# Patient Record
Sex: Female | Born: 1995 | Race: White | Hispanic: No | Marital: Married | Smoking: Never smoker
Health system: Southern US, Community
[De-identification: ages and names within clinical notes are randomized; demographics above are authoritative.]

## PROBLEM LIST (undated history)

## (undated) HISTORY — PX: CHOLECYSTECTOMY: SHX55

---
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
## 2015-03-17 DIAGNOSIS — M7540 Impingement syndrome of unspecified shoulder: Secondary | ICD-10-CM | POA: Insufficient documentation

## 2015-10-16 ENCOUNTER — Ambulatory Visit
Admission: RE | Admit: 2015-10-16 | Discharge: 2015-10-16 | Disposition: A | Discharge status: Home / Self Care | Payer: Medicaid Other | Source: Ambulatory Visit | Attending: Internal Medicine | Admitting: Internal Medicine

## 2015-10-16 ENCOUNTER — Other Ambulatory Visit: Payer: Self-pay | Admitting: Internal Medicine

## 2015-10-16 DIAGNOSIS — R1111 Vomiting without nausea: Secondary | ICD-10-CM | POA: Insufficient documentation

## 2015-10-16 DIAGNOSIS — R12 Heartburn: Secondary | ICD-10-CM | POA: Diagnosis not present

## 2016-10-14 IMAGING — US US ABDOMEN COMPLETE
1 series · 14 of 25 positions shown · non-contrast
Comparison: CT Abdomen and Pelvis 09/19/2013.

CLINICAL DATA: 20-year-old female with chronic postprandial nausea
and heartburn. Initial encounter.

EXAM:
ABDOMEN ULTRASOUND COMPLETE

[Series 1: us abdomen complete · 0.20mm/px · 14 of 85 slices shown]
[im 1/85]
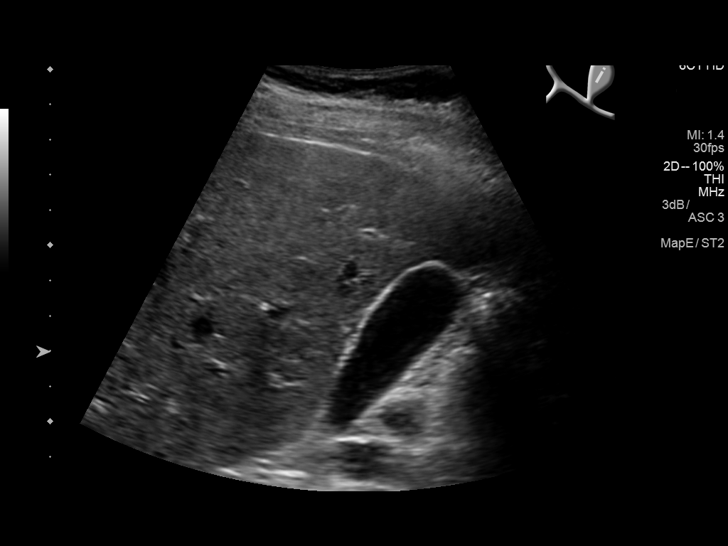
[im 8/85]
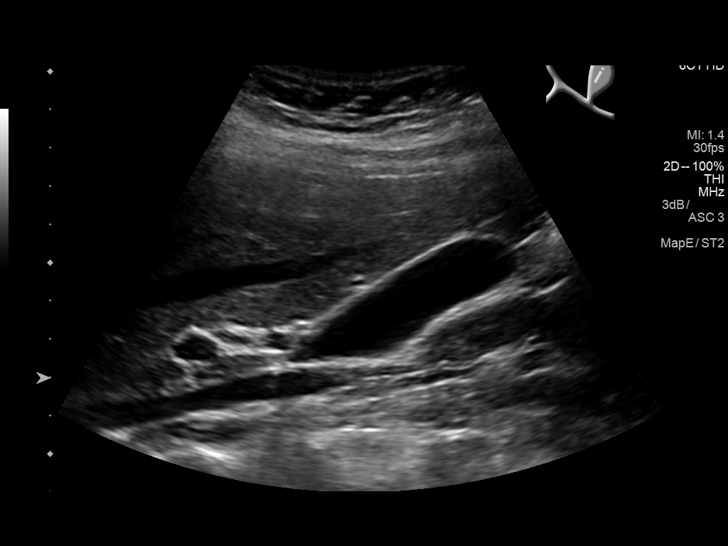
[im 15/85]
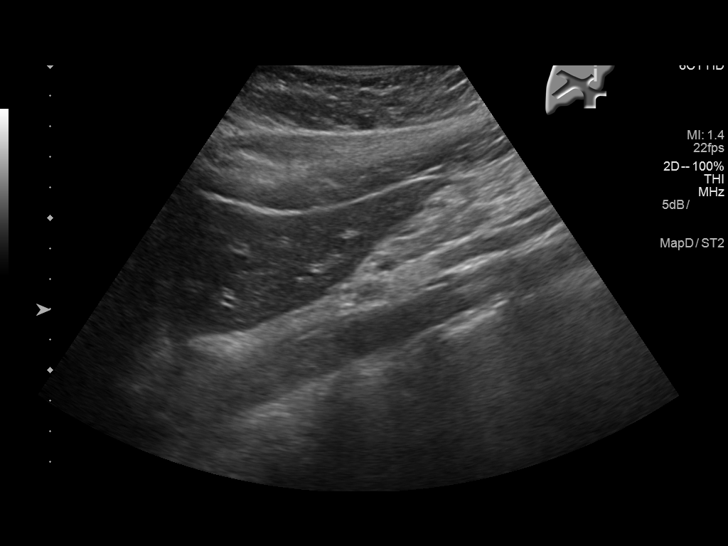
[im 22/85]
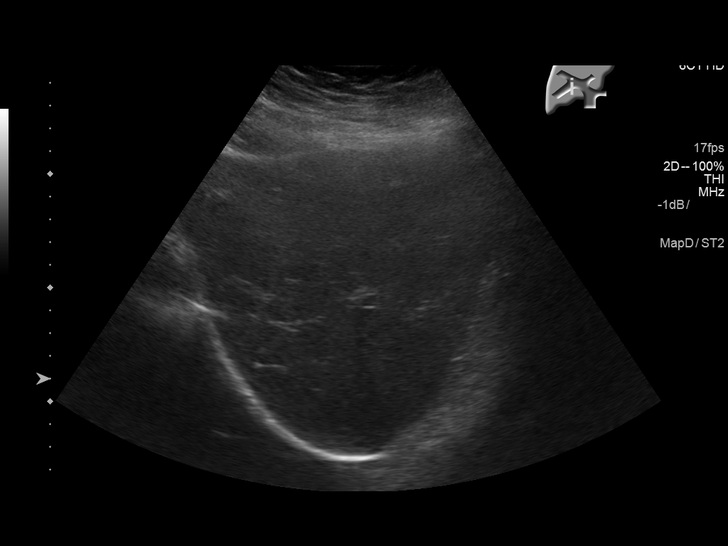
[im 29/85]
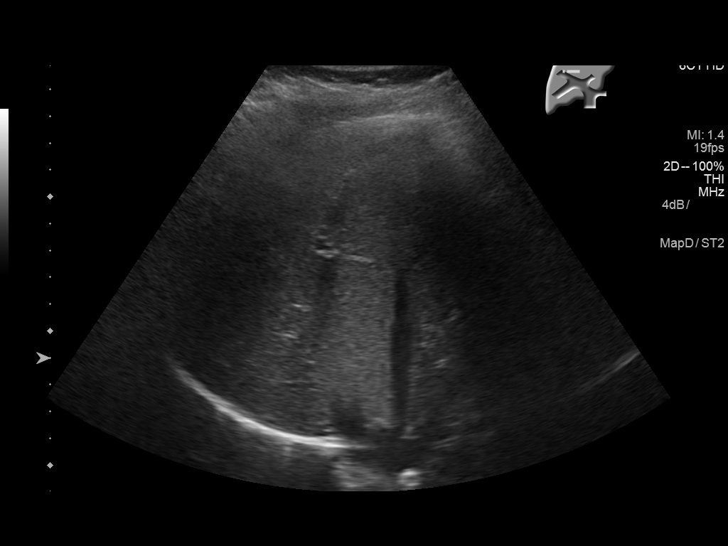
[im 32/85]
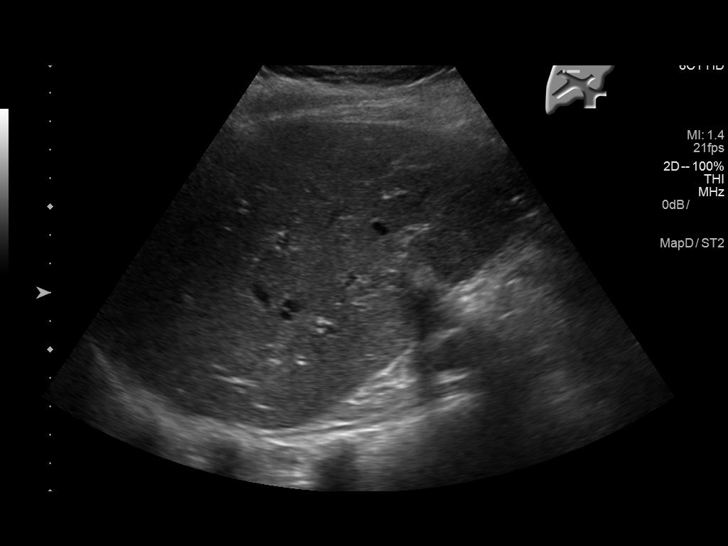
[im 39/85]
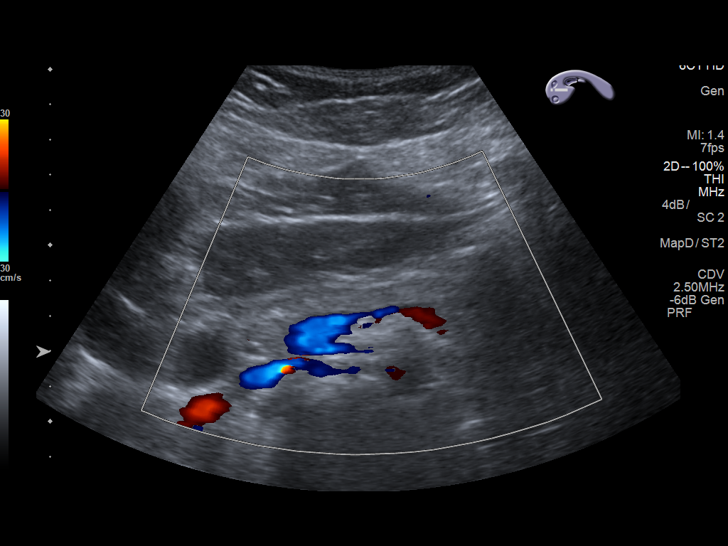
[im 46/85]
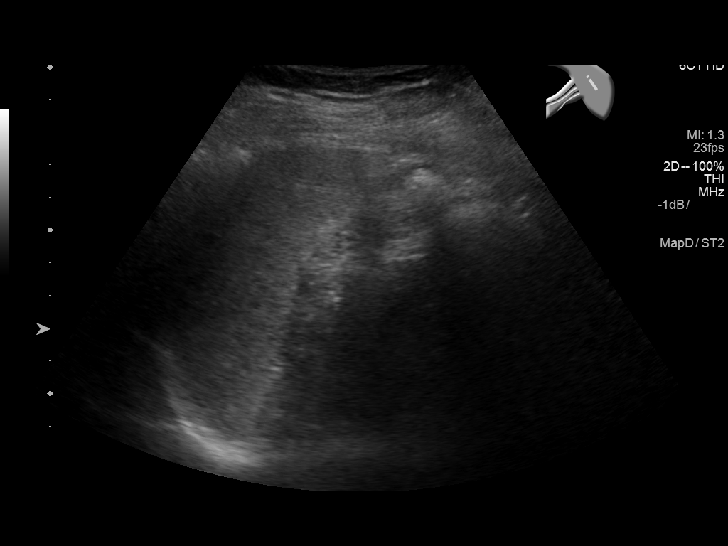
[im 53/85]
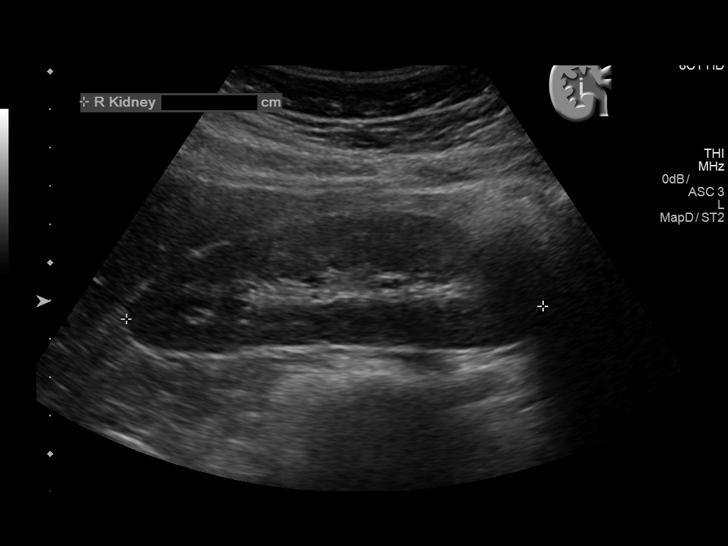
[im 57/85]
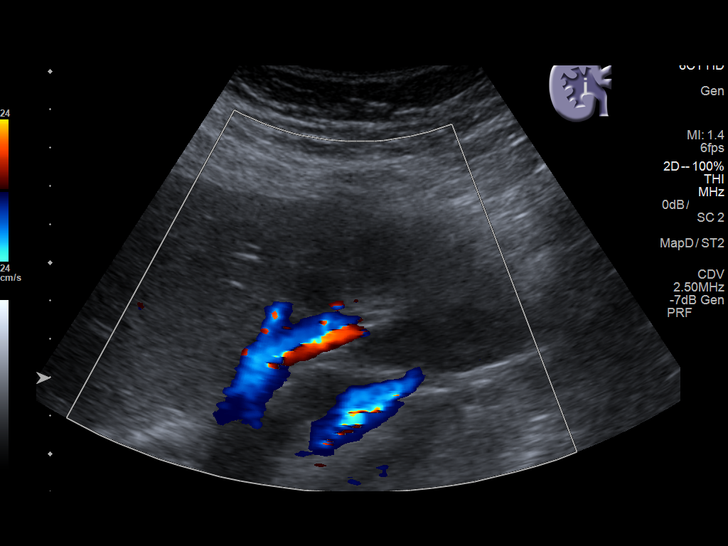
[im 64/85]
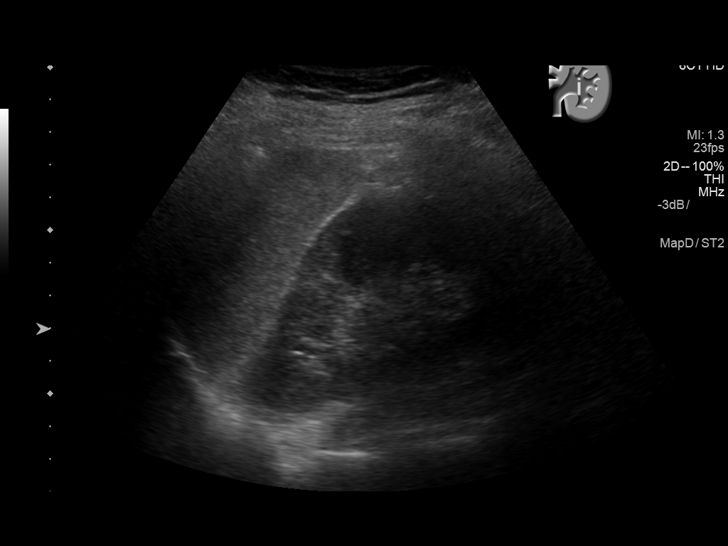
[im 71/85]
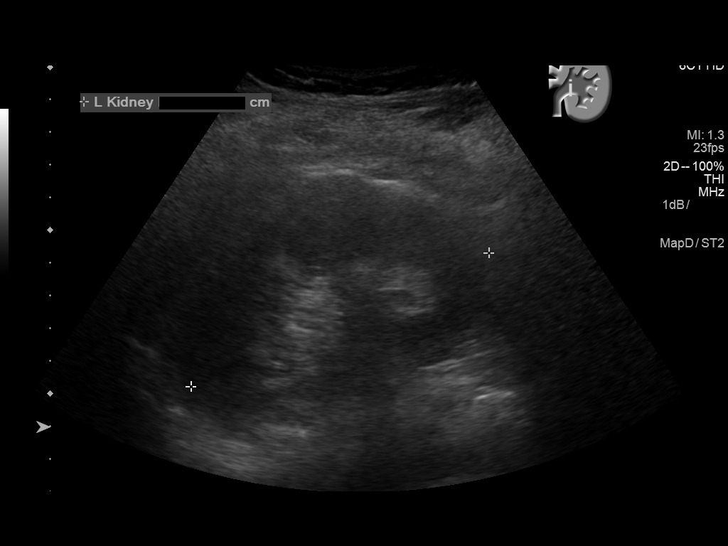
[im 78/85]
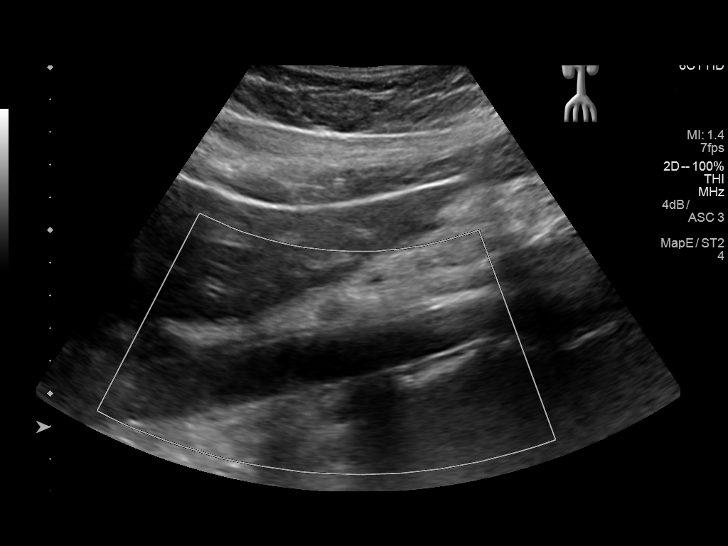
[im 85/85]
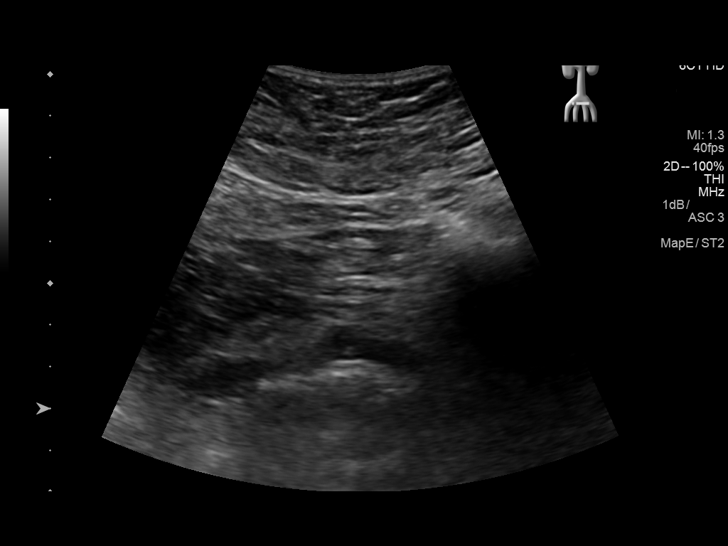

[14 of 25 positions shown; findings below may reference images not displayed]

FINDINGS: Gallbladder: No gallstones or wall thickening visualized. No
sonographic Murphy sign noted by sonographer. No pericholecystic
fluid.

Common bile duct: Diameter: 3 mm, normal

Liver: No focal lesion identified. Within normal limits in
parenchymal echogenicity.

IVC: No abnormality visualized.

Pancreas: Visualized portion unremarkable.

Spleen: Size and appearance within normal limits.

Right Kidney: Length: 10.9 cm. Echogenicity within normal limits. No
mass or hydronephrosis visualized.

Left Kidney: Length: 10.1 cm. Echogenicity within normal limits. No
mass or hydronephrosis visualized.

Abdominal aorta: No aneurysm visualized.

Other findings: None.
IMPRESSION: Normal abdomen ultrasound.  Normal gallbladder.

## 2017-02-03 DIAGNOSIS — R0789 Other chest pain: Secondary | ICD-10-CM | POA: Insufficient documentation

## 2018-04-03 ENCOUNTER — Ambulatory Visit: Payer: Self-pay | Admitting: Adult Health

## 2018-04-03 ENCOUNTER — Encounter: Payer: Self-pay | Admitting: Adult Health

## 2018-04-03 VITALS — BP 116/62 | HR 76 | Temp 98.7°F | Resp 14 | Ht 66.0 in | Wt 180.0 lb

## 2018-04-03 VITALS — BP 116/62 | HR 76 | Temp 98.7°F | Resp 14 | Ht 65.0 in | Wt 180.0 lb

## 2018-04-03 DIAGNOSIS — Z0189 Encounter for other specified special examinations: Secondary | ICD-10-CM

## 2018-04-03 DIAGNOSIS — Z008 Encounter for other general examination: Secondary | ICD-10-CM

## 2018-04-03 DIAGNOSIS — J029 Acute pharyngitis, unspecified: Secondary | ICD-10-CM

## 2018-04-03 DIAGNOSIS — R6889 Other general symptoms and signs: Secondary | ICD-10-CM

## 2018-04-03 DIAGNOSIS — J069 Acute upper respiratory infection, unspecified: Secondary | ICD-10-CM

## 2018-04-03 LAB — POCT RAPID STREP A (OFFICE): Rapid Strep A Screen: NEGATIVE

## 2018-04-03 LAB — POCT INFLUENZA A/B
Influenza A, POC: NEGATIVE
Influenza B, POC: NEGATIVE

## 2018-04-03 MED ORDER — BENZONATATE 100 MG PO CAPS: 100.0000 mg | ORAL_CAPSULE | Freq: Two times a day (BID) | ORAL | 0 refills | Status: DC | PRN

## 2018-04-03 MED ORDER — AZELASTINE HCL 0.15 % NA SOLN
1.0000 | Freq: Every day | NASAL | 0 refills | Status: DC
Start: 2018-04-03 — End: 2018-04-21

## 2018-04-03 NOTE — Progress Notes (Addendum)
[facility]  Subjective:     Patient ID: Felicia Curtis, female   DOB: 05-16-1995, 23 y.o.   MRN: [id number]  HPI   Blood pressure 116/62, pulse 76, temperature 98.7 F (37.1 C), temperature source Oral, resp. rate 14, height 5\' 5"  (1.651 m), weight 180 lb (81.6 kg), last menstrual period 03/07/2018, SpO2 98 %.  Patient is a  23 year old female in no acute distress who comes to the clinic for a biometric screening with brief biometric screening with labs include fasting glucose and lipids.   She also complains of symptoms of a cold that started on 03/31/18, runny nose, congestion, mild headache. She reports he is feeling better today and has nasal congestion.She eused Flonase on friday she reports. She has been using sudafed and mucinex.    History of appendectomy.  Reports non smoker. Denies alcohol or drug use. She reports she is physically active.  No LMP recorded.03/07/2017. LMP regularly.   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  Patient  denies any fever, body aches,chills, rash, chest pain, shortness of breath, nausea, vomiting, or diarrhea.    Allergies  Allergen Reactions  . Sulfa Antibiotics     No current outpatient medications on file.   There are no active problems to display for this patient.   Review of Systems  Constitutional: Positive for fatigue. Negative for activity change, appetite change, chills, diaphoresis, fever and unexpected weight change.  HENT: Positive for congestion, postnasal drip, rhinorrhea and sore throat. Negative for dental problem, drooling, ear discharge, ear pain, facial swelling, hearing loss, mouth sores, nosebleeds, sinus pressure, sinus pain, sneezing, tinnitus, trouble swallowing and voice change.   Respiratory: Positive for cough. Negative for apnea, choking, chest tightness, shortness of breath, wheezing and stridor.   Cardiovascular: Negative.    Gastrointestinal: Negative.   Genitourinary: Negative.   Musculoskeletal: Negative.   Skin: Negative.   Neurological: Negative.   Hematological: Negative.   Psychiatric/Behavioral: Negative.        Objective:   Physical Exam Vitals signs reviewed.  Constitutional:      General: She is not in acute distress.    Appearance: Normal appearance. She is not ill-appearing, toxic-appearing or diaphoretic.  HENT:     Head: Normocephalic and atraumatic.     Right Ear: Tympanic membrane, ear canal and external ear normal. There is no impacted cerumen.     Left Ear: Tympanic membrane, ear canal and external ear normal. There is no impacted cerumen.     Nose: Congestion and rhinorrhea present.     Mouth/Throat:     Mouth: Mucous membranes are dry.     Pharynx: No oropharyngeal exudate or posterior oropharyngeal erythema.  Eyes:     General: No scleral icterus.       Right eye: No discharge.        Left eye: No discharge.     Conjunctiva/sclera: Conjunctivae normal.     Pupils: Pupils are equal, round, and reactive to light.  Neck:     Musculoskeletal: Normal range of motion and neck supple. No neck rigidity or muscular tenderness.  Cardiovascular:     Rate and Rhythm: Normal rate and regular rhythm.     Pulses: Normal pulses.  Pulmonary:     Effort: Pulmonary effort is normal. No respiratory distress.     Breath sounds: Normal breath sounds. No stridor. No wheezing, rhonchi or rales.  Chest:  Chest wall: No tenderness.  Abdominal:     Palpations: Abdomen is soft.  Musculoskeletal: Normal range of motion.  Lymphadenopathy:     Cervical: No cervical adenopathy.  Skin:    General: Skin is warm and dry.     Capillary Refill: Capillary refill takes less than 2 seconds.  Neurological:     Mental Status: She is alert and oriented to person, place, and time.  Psychiatric:        Mood and Affect: Mood normal.        Behavior: Behavior normal.        Thought Content: Thought  content normal.        Judgment: Judgment normal.        Assessment:     Acute upper respiratory infection  Encounter for biometric screening - Plan: Glucose, random, Lipid Panel With LDL/HDL Ratio  FLU A and B POCT NEGATIVE.  STREP A NEGATIVE    Plan:     Gave and reviewed After Visit Summary(AVS) with patient.     Please see your primary care provider for a yearly physical and labs.  I will have the office call you on your glucose and cholesterol results when they return if you have not heard within 1 week please call the office and will have you follow up with your primary care doctor for any abnormal's. This biometric physical is not a substitute for seeing a primary care provider for a physical. Provider also recommends if you do not have a primary care provider for patient see a  primary care physician/ provider  for a routine physical and to establish primary care. Patient may chose provider of choice. Also gave the [place]  PHYSICIAN/PROVIDER  REFERRAL LINE at [phone] or web site at [email or web address] to help assist with finding a primary care doctor. Patient understands this office is acute care and no primary care is in this office.     Follow up with primary care as needed for chronic and maintenance health care- can be seen in this employee clinic for acute care.   Discussed given duration of symptoms likely viral upper respiratory infection with symptom management discussed.   Meds ordered this encounter  Medications  . Azelastine HCl 0.15 % SOLN    Sig: Place 1 spray into the nose daily for 7 days.    Dispense:  0.7 mL    Refill:  0  . benzonatate (TESSALON) 100 MG capsule    Sig: Take 1 capsule (100 mg total) by mouth 2 (two) times daily as needed for cough. For cough may cause drowsiness    Dispense:  20 capsule    Refill:  0    Advised patient call the office or your primary care doctor for an appointment if no improvement within 72 hours or if any  symptoms change or worsen at any time  Advised ER or urgent Care if after hours or on weekend. Call 911 for emergency symptoms at any time.Patinet verbalized understanding of all instructions given/reviewed and treatment plan and has no further questions or concerns at this time.     Follow up with primary care as needed for chronic and maintenance health care- can be seen in this employee clinic for acute care.

## 2018-04-03 NOTE — Progress Notes (Signed)
ERRONEOUS ENCOUNTER SEE OTHER NOTE DATED 04/03/18 - patient was on the schedule twice

## 2018-04-03 NOTE — Addendum Note (Signed)
Addended by: Karen Kays on: 04/03/2018 12:42 PM   Modules accepted: Orders

## 2018-04-03 NOTE — Patient Instructions (Addendum)
Please see your primary care provider for a yearly physical and labs.  I will have the office call you on your glucose and cholesterol results when they return if you have not heard within 1 week please call the office and will have you follow up with your primary care doctor for any abnormal's. This biometric physical is not a substitute for seeing a primary care provider for a physical. Provider also recommends if you do not have a primary care provider for patient see a  primary care physician/ provider  for a routine physical and to establish primary care. Patient may chose provider of choice. Also gave the [facility] at [phone] or web site at [email or web address] to help assist with finding a primary care doctor. Patient understands this office is acute care and no primary care is in this office.  ,   Health Maintenance, Female Adopting a healthy lifestyle and getting preventive care can go a long way to promote health and wellness. Talk with your health care provider about what schedule of regular examinations is right for you. This is a good chance for you to check in with your provider about disease prevention and staying healthy. In between checkups, there are plenty of things you can do on your own. Experts have done a lot of research about which lifestyle changes and preventive measures are most likely to keep you healthy. Ask your health care provider for more information. Weight and diet Eat a healthy diet  Be sure to include plenty of vegetables, fruits, low-fat dairy products, and lean protein.  Do not eat a lot of foods high in solid fats, added sugars, or salt.  Get regular exercise. This is one of the most important things you can do for your health. ? Most adults should exercise for at least 150 minutes each week. The exercise should increase your heart rate and make you sweat (moderate-intensity exercise). ? Most adults should also do  strengthening exercises at least twice a week. This is in addition to the moderate-intensity exercise. Maintain a healthy weight  Body mass index (BMI) is a measurement that can be used to identify possible weight problems. It estimates body fat based on height and weight. Your health care provider can help determine your BMI and help you achieve or maintain a healthy weight.  For females 51 years of age and older: ? A BMI below 18.5 is considered underweight. ? A BMI of 18.5 to 24.9 is normal. ? A BMI of 25 to 29.9 is considered overweight. ? A BMI of 30 and above is considered obese. Watch levels of cholesterol and blood lipids  You should start having your blood tested for lipids and cholesterol at 23 years of age, then have this test every 5 years.  You may need to have your cholesterol levels checked more often if: ? Your lipid or cholesterol levels are high. ? You are older than 23 years of age. ? You are at high risk for heart disease. Cancer screening Lung Cancer  Lung cancer screening is recommended for adults 19-38 years old who are at high risk for lung cancer because of a history of smoking.  A yearly low-dose CT scan of the lungs is recommended for people who: ? Currently smoke. ? Have quit within the past 15 years. ? Have at least a 30-pack-year history of smoking. A pack year is smoking an average of one pack of cigarettes a day for 1  Yearly screening should continue until it has been 15 years since you quit.  Yearly screening should stop if you develop a health problem that would prevent you from having lung cancer treatment. Breast Cancer  Practice breast self-awareness. This means understanding how your breasts normally appear and feel.  It also means doing regular breast self-exams. Let your health care provider know about any changes, no matter how small.  If you are in your 20s or 30s, you should have a clinical breast exam (CBE) by a health care  provider every 1-3 years as part of a regular health exam.  If you are 40 or older, have a CBE every year. Also consider having a breast X-ray (mammogram) every year.  If you have a family history of breast cancer, talk to your health care provider about genetic screening.  If you are at high risk for breast cancer, talk to your health care provider about having an MRI and a mammogram every year.  Breast cancer gene (BRCA) assessment is recommended for women who have family members with BRCA-related cancers. BRCA-related cancers include: ? Breast. ? Ovarian. ? Tubal. ? Peritoneal cancers.  Results of the assessment will determine the need for genetic counseling and BRCA1 and BRCA2 testing. Cervical Cancer Your health care provider may recommend that you be screened regularly for cancer of the pelvic organs (ovaries, uterus, and vagina). This screening involves a pelvic examination, including checking for microscopic changes to the surface of your cervix (Pap test). You may be encouraged to have this screening done every 3 years, beginning at age 21.  For women ages 30-65, health care providers may recommend pelvic exams and Pap testing every 3 years, or they may recommend the Pap and pelvic exam, combined with testing for human papilloma virus (HPV), every 5 years. Some types of HPV increase your risk of cervical cancer. Testing for HPV may also be done on women of any age with unclear Pap test results.  Other health care providers may not recommend any screening for nonpregnant women who are considered low risk for pelvic cancer and who do not have symptoms. Ask your health care provider if a screening pelvic exam is right for you.  If you have had past treatment for cervical cancer or a condition that could lead to cancer, you need Pap tests and screening for cancer for at least 20 years after your treatment. If Pap tests have been discontinued, your risk factors (such as having a new sexual  partner) need to be reassessed to determine if screening should resume. Some women have medical problems that increase the chance of getting cervical cancer. In these cases, your health care provider may recommend more frequent screening and Pap tests. Colorectal Cancer  This type of cancer can be detected and often prevented.  Routine colorectal cancer screening usually begins at 23 years of age and continues through 23 years of age.  Your health care provider may recommend screening at an earlier age if you have risk factors for colon cancer.  Your health care provider may also recommend using home test kits to check for hidden blood in the stool.  A small camera at the end of a tube can be used to examine your colon directly (sigmoidoscopy or colonoscopy). This is done to check for the earliest forms of colorectal cancer.  Routine screening usually begins at age 50.  Direct examination of the colon should be repeated every 5-10 years through 23 years of age. However, you may   need to be screened more often if early forms of precancerous polyps or small growths are found. Skin Cancer  Check your skin from head to toe regularly.  Tell your health care provider about any new moles or changes in moles, especially if there is a change in a mole's shape or color.  Also tell your health care provider if you have a mole that is larger than the size of a pencil eraser.  Always use sunscreen. Apply sunscreen liberally and repeatedly throughout the day.  Protect yourself by wearing long sleeves, pants, a wide-brimmed hat, and sunglasses whenever you are outside. Heart disease, diabetes, and high blood pressure  High blood pressure causes heart disease and increases the risk of stroke. High blood pressure is more likely to develop in: ? People who have blood pressure in the high end of the normal range (130-139/85-89 mm Hg). ? People who are overweight or obese. ? People who are African  American.  If you are 18-39 years of age, have your blood pressure checked every 3-5 years. If you are 40 years of age or older, have your blood pressure checked every year. You should have your blood pressure measured twice-once when you are at a hospital or clinic, and once when you are not at a hospital or clinic. Record the average of the two measurements. To check your blood pressure when you are not at a hospital or clinic, you can use: ? An automated blood pressure machine at a pharmacy. ? A home blood pressure monitor.  If you are between 55 years and 79 years old, ask your health care provider if you should take aspirin to prevent strokes.  Have regular diabetes screenings. This involves taking a blood sample to check your fasting blood sugar level. ? If you are at a normal weight and have a low risk for diabetes, have this test once every three years after 23 years of age. ? If you are overweight and have a high risk for diabetes, consider being tested at a younger age or more often. Preventing infection Hepatitis B  If you have a higher risk for hepatitis B, you should be screened for this virus. You are considered at high risk for hepatitis B if: ? You were born in a country where hepatitis B is common. Ask your health care provider which countries are considered high risk. ? Your parents were born in a high-risk country, and you have not been immunized against hepatitis B (hepatitis B vaccine). ? You have HIV or AIDS. ? You use needles to inject street drugs. ? You live with someone who has hepatitis B. ? You have had sex with someone who has hepatitis B. ? You get hemodialysis treatment. ? You take certain medicines for conditions, including cancer, organ transplantation, and autoimmune conditions. Hepatitis C  Blood testing is recommended for: ? Everyone born from 1945 through 1965. ? Anyone with known risk factors for hepatitis C. Sexually transmitted infections  (STIs)  You should be screened for sexually transmitted infections (STIs) including gonorrhea and chlamydia if: ? You are sexually active and are younger than 24 years of age. ? You are older than 24 years of age and your health care provider tells you that you are at risk for this type of infection. ? Your sexual activity has changed since you were last screened and you are at an increased risk for chlamydia or gonorrhea. Ask your health care provider if you are at risk.  If you   If you do not have HIV, but are at risk, it may be recommended that you take a prescription medicine daily to prevent HIV infection. This is called pre-exposure prophylaxis (PrEP). You are considered at risk if: ? You are sexually active and do not regularly use condoms or know the HIV status of your partner(s). ? You take drugs by injection. ? You are sexually active with a partner who has HIV. Talk with your health care provider about whether you are at high risk of being infected with HIV. If you choose to begin PrEP, you should first be tested for HIV. You should then be tested every 3 months for as long as you are taking PrEP. Pregnancy  If you are premenopausal and you may become pregnant, ask your health care provider about preconception counseling.  If you may become pregnant, take 400 to 800 micrograms (mcg) of folic acid every day.  If you want to prevent pregnancy, talk to your health care provider about birth control (contraception). Osteoporosis and menopause  Osteoporosis is a disease in which the bones lose minerals and strength with aging. This can result in serious bone fractures. Your risk for osteoporosis can be identified using a bone density scan.  If you are 93 years of age or older, or if you are at risk for osteoporosis and fractures, ask your health care provider if you should be screened.  Ask your health care provider whether you should take a calcium or vitamin D supplement to lower your risk  for osteoporosis.  Menopause may have certain physical symptoms and risks.  Hormone replacement therapy may reduce some of these symptoms and risks. Talk to your health care provider about whether hormone replacement therapy is right for you. Follow these instructions at home:  Schedule regular health, dental, and eye exams.  Stay current with your immunizations.  Do not use any tobacco products including cigarettes, chewing tobacco, or electronic cigarettes.  If you are pregnant, do not drink alcohol.  If you are breastfeeding, limit how much and how often you drink alcohol.  Limit alcohol intake to no more than 1 drink per day for nonpregnant women. One drink equals 12 ounces of beer, 5 ounces of wine, or 1 ounces of hard liquor.  Do not use street drugs.  Do not share needles.  Ask your health care provider for help if you need support or information about quitting drugs.  Tell your health care provider if you often feel depressed.  Tell your health care provider if you have ever been abused or do not feel safe at home. This information is not intended to replace advice given to you by your health care provider. Make sure you discuss any questions you have with your health care provider. Document Released: 08/31/2010 Document Revised: 07/24/2015 Document Reviewed: 11/19/2014 Elsevier Interactive Patient Education  2019 [place].  Upper Respiratory Infection, Adult An upper respiratory infection (URI) affects the nose, throat, and upper air passages. URIs are caused by germs (viruses). The most common type of URI is often called "the common cold." Medicines cannot cure URIs, but you can do things at home to relieve your symptoms. URIs usually get better within 7-10 days. Follow these instructions at home: Activity  Rest as needed.  If you have a fever, stay home from work or school until your fever is gone, or until your doctor says you may return to work or  school. ? You should stay home until you cannot spread the infection  anymore (you are not contagious). ? Your doctor may have you wear a face mask so you have less risk of spreading the infection. Relieving symptoms  Gargle with a salt-water mixture 3-4 times a day or as needed. To make a salt-water mixture, completely dissolve -1 tsp of salt in 1 cup of warm water.  Use a cool-mist humidifier to add moisture to the air. This can help you breathe more easily. Eating and drinking   Drink enough fluid to keep your pee (urine) pale yellow.  Eat soups and other clear broths. General instructions   Take over-the-counter and prescription medicines only as told by your doctor. These include cold medicines, fever reducers, and cough suppressants.  Do not use any products that contain nicotine or tobacco. These include cigarettes and e-cigarettes. If you need help quitting, ask your doctor.  Avoid being where people are smoking (avoid secondhand smoke).  Make sure you get regular shots and get the flu shot every year.  Keep all follow-up visits as told by your doctor. This is important. How to avoid spreading infection to others   Wash your hands often with soap and water. If you do not have soap and water, use hand sanitizer.  Avoid touching your mouth, face, eyes, or nose.  Cough or sneeze into a tissue or your sleeve or elbow. Do not cough or sneeze into your hand or into the air. Contact a doctor if:  You are getting worse, not better.  You have any of these: ? A fever. ? Chills. ? Brown or red mucus in your nose. ? Yellow or brown fluid (discharge)coming from your nose. ? Pain in your face, especially when you bend forward. ? Swollen neck glands. ? Pain with swallowing. ? White areas in the back of your throat. Get help right away if:  You have shortness of breath that gets worse.  You have very bad or constant: ? Headache. ? Ear pain. ? Pain in your forehead,  behind your eyes, and over your cheekbones (sinus pain). ? Chest pain.  You have long-lasting (chronic) lung disease along with any of these: ? Wheezing. ? Long-lasting cough. ? Coughing up blood. ? A change in your usual mucus.  You have a stiff neck.  You have changes in your: ? Vision. ? Hearing. ? Thinking. ? Mood. Summary  An upper respiratory infection (URI) is caused by a germ called a virus. The most common type of URI is often called "the common cold."  URIs usually get better within 7-10 days.  Take over-the-counter and prescription medicines only as told by your doctor. This information is not intended to replace advice given to you by your health care provider. Make sure you discuss any questions you have with your health care provider. Document Released: 08/04/2007 Document Revised: 10/08/2016 Document Reviewed: 10/08/2016 Elsevier Interactive Patient Education  2019 [place] capsules What is this medicine? BENZONATATE (ben ZOE na tate) is used to treat cough. This medicine may be used for other purposes; ask your health care provider or pharmacist if you have questions. COMMON BRAND NAME(S): Tessalon Perles, Zonatuss What should I tell my health care provider before I take this medicine? They need to know if you have any of these conditions: -kidney or liver disease -an unusual or allergic reaction to benzonatate, anesthetics, other medicines, foods, dyes, or preservatives -pregnant or trying to get pregnant -breast-feeding How should I use this medicine? Take this medicine by mouth with a glass of water. Follow  the directions on the prescription label. Avoid breaking, chewing, or sucking the capsule, as this can cause serious side effects. Take your medicine at regular intervals. Do not take your medicine more often than directed. Talk to your pediatrician regarding the use of this medicine in children. While this drug may be prescribed for  children as young as 85 years old for selected conditions, precautions do apply. Overdosage: If you think you have taken too much of this medicine contact a poison control center or emergency room at once. NOTE: This medicine is only for you. Do not share this medicine with others. What if I miss a dose? If you miss a dose, take it as soon as you can. If it is almost time for your next dose, take only that dose. Do not take double or extra doses. What may interact with this medicine? Do not take this medicine with any of the following medications: -MAOIs like Carbex, Eldepryl, Marplan, Nardil, and Parnate This list may not describe all possible interactions. Give your health care provider a list of all the medicines, herbs, non-prescription drugs, or dietary supplements you use. Also tell them if you smoke, drink alcohol, or use illegal drugs. Some items may interact with your medicine. What should I watch for while using this medicine? Tell your doctor if your symptoms do not improve or if they get worse. If you have a high fever, skin rash, or headache, see your health care professional. You may get drowsy or dizzy. Do not drive, use machinery, or do anything that needs mental alertness until you know how this medicine affects you. Do not sit or stand up quickly, especially if you are an older patient. This reduces the risk of dizzy or fainting spells. What side effects may I notice from receiving this medicine? Side effects that you should report to your doctor or health care professional as soon as possible: -allergic reactions like skin rash, itching or hives, swelling of the face, lips, or tongue -breathing problems -chest pain -confusion or hallucinations -irregular heartbeat -numbness of mouth or throat -seizures Side effects that usually do not require medical attention (report to your doctor or health care professional if they continue or are bothersome): -burning feeling in the  eyes -constipation -headache -nasal congestion -stomach upset This list may not describe all possible side effects. Call your doctor for medical advice about side effects. You may report side effects to FDA at 1-800-FDA-1088. Where should I keep my medicine? Keep out of the reach of children. Store at room temperature between 15 and 30 degrees C (59 and 86 degrees F). Keep tightly closed. Protect from light and moisture. Throw away any unused medicine after the expiration date. NOTE: This sheet is a summary. It may not cover all possible information. If you have questions about this medicine, talk to your doctor, pharmacist, or health care provider.  2019 Elsevier/Gold Standard (2007-05-17 14:52:56) Azelastine nasal spray What is this medicine? AZELASTINE (a ZEL as teen) nasal spray is a histamine blocker. It helps to relieve itching, running and stuffiness in the nose. This medicine is used to treat nasal symptoms from allergies and other irritants. This medicine may be used for other purposes; ask your health care provider or pharmacist if you have questions. COMMON BRAND NAME(S): Astelin, Astepro What should I tell my health care provider before I take this medicine? They need to know if you have any of these conditions: -any other medical problems -an unusual or allergic reaction to azelastine,  other nasal sprays, medicines, foods, dyes, or preservatives -pregnant or trying to become pregnant -breast-feeding How should I use this medicine? This medicine is for use only in the nose. Follow the directions on the prescription label. Do not use more often than directed. Make sure that you are using your inhaler correctly. Ask you doctor or health care provider if you have any questions. Talk to your pediatrician regarding the use of this medicine in children. While some products may be prescribed for children as young as 6 months for selected conditions, precautions do apply. Overdosage: If  you think you have taken too much of this medicine contact a poison control center or emergency room at once. NOTE: This medicine is only for you. Do not share this medicine with others. What if I miss a dose? If you miss a dose, use it as soon as you can. If it is almost time for your next dose, use only that dose. Do not use double or extra doses. What may interact with this medicine? -cimetidine -other antihistamines This list may not describe all possible interactions. Give your health care provider a list of all the medicines, herbs, non-prescription drugs, or dietary supplements you use. Also tell them if you smoke, drink alcohol, or use illegal drugs. Some items may interact with your medicine. What should I watch for while using this medicine? Tell your doctor or health care professional if your symptoms do not improve. Do not use extra medicine. Do not spray in your eyes. This medicine may make you feel confused, dizzy or lightheaded. Drinking alcohol or taking medicine that causes drowsiness can make this worse. Do not drive, use machinery, or do anything that needs mental alertness until you know how this medicine affects you. What side effects may I notice from receiving this medicine? Side effects that you should report to your doctor or health care professional as soon as possible: -allergic reactions like skin rash, itching or hives, swelling of the face, lips, or tongue -breathing problems -fast heartbeat -high blood pressure -infection Side effects that usually do not require medical attention (report to your doctor or health care professional if they continue or are bothersome): -bitter taste -cough -feeling tired -headache -larger appetite or weight gain -nose or throat irritation -nosebleed -sneezing This list may not describe all possible side effects. Call your doctor for medical advice about side effects. You may report side effects to FDA at 1-800-FDA-1088. Where  should I keep my medicine? Keep out of the reach of children. Store upright and tightly closed at room temperature between 20 and 25 degrees C (68 and 77 degrees F). Do not freeze. Throw away any unused medicine after the expiration date or after 200 sprays, whichever comes first. NOTE: This sheet is a summary. It may not cover all possible information. If you have questions about this medicine, talk to your doctor, pharmacist, or health care provider.  2019 Elsevier/Gold Standard (2013-04-24 15:52:44)

## 2018-04-04 LAB — LIPID PANEL WITH LDL/HDL RATIO
CHOLESTEROL TOTAL: 166 mg/dL (ref 100–199)
HDL: 49 mg/dL (ref >39)
LDL CALC: 100 mg/dL — AB (ref 0–99)
LDL/HDL RATIO: 2 ratio (ref 0.0–3.2)
Triglycerides: 86 mg/dL (ref 0–149)
VLDL Cholesterol Cal: 17 mg/dL (ref 5–40)

## 2018-04-04 LAB — GLUCOSE, RANDOM: GLUCOSE: 73 mg/dL (ref 65–99)

## 2018-04-04 NOTE — Progress Notes (Signed)
Fasting glucose is normal.  LDL 100 which is just minimally over the range of 0-99. Diet of low cholesterol, low fast and regular exercise is recommended as well as regular primary care follow up.

## 2018-04-05 ENCOUNTER — Other Ambulatory Visit: Payer: Self-pay

## 2018-04-06 LAB — UPPER RESPIRATORY CULTURE, ROUTINE

## 2018-04-10 NOTE — Progress Notes (Signed)
Collene Gobbleaub, Steven A, MD  Ward, Bretlyn, CMA; Selby Slovacek, Eula FriedMichelle S, FNP Call patient and let her know she did have a group B beta-hemolytic strep. Her symptoms may have cleared on their own. If she still has any respiratory symptoms advise amoxicillin 875 twice daily for 10 days. She should also be on a probiotic. If she is on birth control pills she needs to use birth control backup. If her respiratory symptoms have completely resolved without treatment she does not have to be on a course of antibiotics. This type of strep is not associated with cardiac or renal complications.

## 2018-04-11 ENCOUNTER — Telehealth: Payer: Self-pay | Admitting: Adult Health

## 2018-04-11 MED ORDER — AMOXICILLIN 875 MG PO TABS: 875.0000 mg | ORAL_TABLET | Freq: Two times a day (BID) | ORAL | 0 refills | Status: DC

## 2018-04-11 NOTE — Telephone Encounter (Addendum)
Message below relayed to patient on 04/11/18 at 1:20pm she did speak with Bretlyn last week while provider was on vacation  and was informed of results. She reports antibiotic was not at pharmacy that Dr. Cleta Alberts was calling in. Patient is feeling better but does not feel as if her scratch throat and cough have completely gone away. Denies any hoarse voice or difficulty swallowing. Patient speaks in full complete sentences on the phone and is not in any acute distress.  Patient  denies any fever, body aches,chills, rash, chest pain, shortness of breath, nausea, vomiting, or diarrhea.  She is advised of below message from Dr. Cleta Alberts.   Sent medication to patients preferred pharmacy [facility] in [place]  Meds ordered this encounter  Medications  . amoxicillin (AMOXIL) 875 MG tablet    Sig: Take 1 tablet (875 mg total) by mouth 2 (two) times daily.    Dispense:  20 tablet    Refill:  0   Advised patient call the office or your primary care doctor for an appointment if no improvement within 72 hours or if any symptoms change or worsen at any time  Advised ER or urgent Care if after hours or on weekend. Call 911 for emergency symptoms at any time.Patinet verbalized understanding of all instructions given/reviewed and treatment plan and has no further questions or concerns at this time.      Collene Gobble, MD  Call patient and let her know she did have a group B beta-hemolytic strep. Her symptoms may have cleared on their own. If she still has any respiratory symptoms advise amoxicillin 875 twice daily for 10 days. She should also be on a probiotic. If she is on birth control pills she needs to use birth control backup. If her respiratory symptoms have completely resolved without treatment she does not have to be on a course of antibiotics. This type of strep is not associated with cardiac or renal complications

## 2018-04-11 NOTE — Telephone Encounter (Signed)
No answer attempted to reach patient with results.  Left generic message to return call to office 04/11/2018 11:28am.

## 2018-04-11 NOTE — Telephone Encounter (Signed)
04/11/2018 8:08 am called patient left message to return call to clinic regarding culture results. Group B strep.

## 2018-04-11 NOTE — Telephone Encounter (Signed)
Error no additional contact was made.

## 2018-04-21 ENCOUNTER — Encounter: Payer: Self-pay | Admitting: Emergency Medicine

## 2018-04-21 ENCOUNTER — Other Ambulatory Visit: Payer: Self-pay

## 2018-04-21 ENCOUNTER — Ambulatory Visit
Admission: EM | Admit: 2018-04-21 | Discharge: 2018-04-21 | Disposition: A | Discharge status: Home / Self Care | Payer: Managed Care, Other (non HMO) | Attending: Family Medicine | Admitting: Family Medicine

## 2018-04-21 DIAGNOSIS — W010XXA Fall on same level from slipping, tripping and stumbling without subsequent striking against object, initial encounter: Secondary | ICD-10-CM

## 2018-04-21 DIAGNOSIS — S060X0A Concussion without loss of consciousness, initial encounter: Secondary | ICD-10-CM

## 2018-04-21 MED ORDER — NAPROXEN 500 MG PO TABS: 500.0000 mg | ORAL_TABLET | Freq: Two times a day (BID) | ORAL | 0 refills | Status: DC | PRN

## 2018-04-21 NOTE — ED Provider Notes (Signed)
[facility]    CSN: [id number] Arrival date & time: 04/21/18  7867  History   Chief Complaint Chief Complaint  Patient presents with  . Fall    (04/21/2018)   HPI  23 year old female presents for evaluation after suffering a fall.  Patient was going home this morning.  Patient was hit towards her house when she slipped and fell backwards.  She hit her head on the concrete step.  Patient reports that she has had a headache but has now improved following Motrin.  She endorses photophobia/photosensitivity.  She states that her eyes are heavy.  Reports dizziness, nausea.  No vomiting.  No loss of consciousness.  Patient seems to have difficulty focusing.  Symptoms are moderate in severity currently.  Seems to be worse due to the fact that she worked all night last night.  Her significant other states that she seems to be at her baseline.  No other associated symptoms.  No other complaints.   PMH, Surgical Hx, Family Hx, Social History reviewed and updated as below.  PMH: Patient Active Problem List   Diagnosis Date Noted  . 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 Acute appendicitis 09/20/2013   Past Surgical History:  Procedure Laterality Date  . APPENDECTOMY  08/2013   OB History   No obstetric history on file.    Home Medications    Prior to Admission medications   Medication Sig Start Date End Date Taking? Authorizing Provider  naproxen (NAPROSYN) 500 MG tablet Take 1 tablet (500 mg total) by mouth 2 (two) times daily as needed for moderate pain or headache. 04/21/18   Tommie Sams, DO    Family History Family History  Problem Relation Age of Onset  . Healthy Mother   . Healthy Father     Social History Social History   Tobacco Use  . Smoking status: Never Smoker  . Smokeless tobacco: Never Used  Substance Use Topics  . Alcohol use: No  . Drug use: Never     Allergies   Sulfa antibiotics   Review of Systems Review of Systems    Eyes: Positive for photophobia.  Gastrointestinal: Positive for nausea.  Neurological: Positive for dizziness and headaches.   Physical Exam Triage Vital Signs ED Triage Vitals  Enc Vitals Group     BP 04/21/18 0900 (!) 112/93     Pulse Rate 04/21/18 0900 66     Resp 04/21/18 0900 18     Temp 04/21/18 0900 97.8 F (36.6 C)     Temp Source 04/21/18 0900 Oral     SpO2 04/21/18 0900 100 %     Weight 04/21/18 0856 180 lb (81.6 kg)     Height 04/21/18 0856 5\' 6"  (1.676 m)     Head Circumference --      Peak Flow --      Pain Score 04/21/18 0855 0     Pain Loc --      Pain Edu? --      Excl. in GC? --    Updated Vital Signs BP (!) 112/93 (BP Location: Left Arm)   Pulse 66   Temp 97.8 F (36.6 C) (Oral)   Resp 18   Ht 5\' 6"  (1.676 m)   Wt 81.6 kg   LMP 04/10/2018 (Approximate)   SpO2 100%   BMI 29.05 kg/m   Visual Acuity Right Eye Distance:   Left Eye Distance:   Bilateral Distance:    Right Eye Near:  Left Eye Near:    Bilateral Near:     Physical Exam Vitals signs and nursing note reviewed.  Constitutional:      General: She is not in acute distress.    Appearance: Normal appearance.  HENT:     Head:     Comments: Np step off's or hematomas.    Right Ear: Tympanic membrane normal.     Left Ear: Tympanic membrane normal.     Nose: Nose normal.     Mouth/Throat:     Pharynx: Oropharynx is clear. No posterior oropharyngeal erythema.  Eyes:     Extraocular Movements: Extraocular movements intact.     Conjunctiva/sclera: Conjunctivae normal.     Pupils: Pupils are equal, round, and reactive to light.  Cardiovascular:     Rate and Rhythm: Regular rhythm. Tachycardia present.  Pulmonary:     Effort: Pulmonary effort is normal.     Breath sounds: Normal breath sounds.  Neurological:     General: No focal deficit present.     Mental Status: She is alert and oriented to person, place, and time.     Cranial Nerves: No cranial nerve deficit.     Motor: No  weakness.     Coordination: Coordination normal.  Psychiatric:        Mood and Affect: Mood normal.        Behavior: Behavior normal.    UC Treatments / Results  Labs (all labs ordered are listed, but only abnormal results are displayed) Labs Reviewed - No data to display  EKG None  Radiology No results found.  Procedures Procedures (including critical care time)  Medications Ordered in UC Medications - No data to display  Initial Impression / Assessment and Plan / UC Course  I have reviewed the triage vital signs and the nursing notes.  Pertinent labs & imaging results that were available during my care of the patient were reviewed by me and considered in my medical decision making (see chart for details).    23 year old female presents with a concussion after suffering a fall.  Naproxen as directed.  Advised cognitive and physical rest.  Work note given.  Final Clinical Impressions(s) / UC Diagnoses   Final diagnoses:  Concussion without loss of consciousness, initial encounter     Discharge Instructions     Rest.  Medication as needed for headache.  Take care  Dr. Adriana Simas    ED Prescriptions    Medication Sig Dispense Auth. Provider   naproxen (NAPROSYN) 500 MG tablet Take 1 tablet (500 mg total) by mouth 2 (two) times daily as needed for moderate pain or headache. 30 tablet Tommie Sams, DO     Controlled Substance Prescriptions [place] Controlled Substance Registry consulted? Not Applicable   Tommie Sams, [place] 04/21/18 [phone]

## 2018-04-21 NOTE — ED Triage Notes (Signed)
Pt fell this morning and hit the back of her head on the edge of the concrete step. Pt has nausea, dizziness, headache, and her eye feel heavy sensitive to light and "fluttery". Pt boyfriend reports that she was "talking funny" after she had fallen asleep and woke up. She took ibuprofen 800 mg.

## 2018-04-21 NOTE — Discharge Instructions (Signed)
Rest.  Medication as needed for headache.  Take care  Dr. Adriana Simas

## 2018-09-15 ENCOUNTER — Ambulatory Visit
Admission: RE | Admit: 2018-09-15 | Discharge: 2018-09-15 | Disposition: A | Discharge status: Home / Self Care | Payer: Managed Care, Other (non HMO) | Source: Ambulatory Visit | Attending: Adult Health | Admitting: Adult Health

## 2018-09-15 ENCOUNTER — Encounter: Payer: Self-pay | Admitting: Adult Health

## 2018-09-15 ENCOUNTER — Ambulatory Visit
Admission: RE | Admit: 2018-09-15 | Discharge: 2018-09-15 | Disposition: A | Discharge status: Home / Self Care | Payer: Managed Care, Other (non HMO) | Attending: Adult Health | Admitting: Adult Health

## 2018-09-15 ENCOUNTER — Ambulatory Visit: Payer: Managed Care, Other (non HMO) | Admitting: Adult Health

## 2018-09-15 ENCOUNTER — Other Ambulatory Visit: Payer: Self-pay

## 2018-09-15 VITALS — BP 108/70 | HR 72 | Temp 98.9°F | Resp 18

## 2018-09-15 DIAGNOSIS — S99921A Unspecified injury of right foot, initial encounter: Secondary | ICD-10-CM | POA: Diagnosis not present

## 2018-09-15 LAB — POCT URINE PREGNANCY: Preg Test, Ur: NEGATIVE

## 2018-09-15 NOTE — Progress Notes (Signed)
[name]  SUBJECTIVE: Felicia Curtis is a 23 y.o. female who sustained a right foot injury 09/14/18 she dropped wake board on her right foot.  Mechanism of injury dropping knee board on her foot. Immediate symptoms: immediate pain, delayed swelling. Symptoms have been acute since that time. Prior history of related problems: no prior problems with this area in the past. She does have history of left ankle break in past. Denies any history for right foot. Denies any previous trauma.  Denies abuse. 6/10 pain on pain scale with walking 3/10 pain right foot with sitting still.   Denies popping or cracking in foot.   Patient  denies any fever, body aches,chills, rash, chest pain, shortness of breath, nausea, vomiting, or diarrhea.   LMP due any day now she reports. Denies pregnancy.   OBJECTIVE: Vital signs as noted above. Appearance: alert, well appearing, and in no distress, oriented to person, place, and time, acyanotic, in no respiratory distress and well hydrated. Foot/ankle exam: soft tissue swelling and tenderness at the right 1st metatarsal area with mild  ecchymoses and mild swelling in this area only.  Range of motion difficult due to painful with movement, she does have normal pronation, supination, external and internal rotation of foot. Gait is with limp avoiding full weight on right foot. 2 X-ray: ordered, but results not yet available.  ASSESSMENT: foot sprain and contusion  PLAN: rest the injured area as much as practical, apply ice packs, elevate the injured limb, compressive bandage, X-Ray ordered, see primary care physician in follow up See orders for this visit as documented in the electronic medical record.  Will call with x ray results. Information given for [facility] should symptoms worsen or change.  Discussed [place] and when to seek emergency medical treatment immediately  Motrin 800 mg every 8 hours for right  foot pain as needed. Discussed renal and gastrointestinal side effects.  Advised patient call the office or your primary care doctor for an appointment if no improvement within 72 hours or if any symptoms change or worsen at any time  Advised ER or urgent Care if after hours or on weekend. Call 911 for emergency symptoms at any time.Patinet verbalized understanding of all instructions given/reviewed and treatment plan and has no further questions or concerns at this time.   Results for orders placed or performed in visit on 09/15/18 (from the past 24 hour(s))  POCT Urine Pregnancy (CPT 81025)     Status: Normal   Collection Time: 09/15/18  1:21 PM  Result Value Ref Range   Preg Test, Ur Negative Negative    .

## 2018-09-15 NOTE — Patient Instructions (Signed)
May use Motrin 800 mg every eight hours as needed for pain. I will call you with your x ray results. Should any symptoms worsen be seen at [facility] or urgent care/ emergency room immediately.   Apply a compressive ACE bandage. Rest and elevate the affected painful area.  Apply cold compresses intermittently as needed.  As pain recedes, begin normal activities slowly as tolerated.  Call if symptoms persist.  RICE Therapy for Routine Care of Injuries Many injuries can be cared for with rest, ice, compression, and elevation (RICE therapy). This includes:  Resting the injured part.  Putting ice on the injury.  Putting pressure (compression) on the injury.  Raising the injured part (elevation). Using RICE therapy can help to lessen pain and swelling. Supplies needed:  Ice.  Plastic bag.  Towel.  Elastic bandage.  Pillow or pillows to raise (elevate) your injured body part. How to care for your injury with RICE therapy Rest Limit your normal activities, and try not to use the injured part of your body. You can go back to your normal activities when your doctor says it is okay to do them and you feel okay. Ask your doctor if you should do exercises to help your injury get better. Ice Put ice on the injured area. Do not put ice on your bare skin.  Put ice in a plastic bag.  Place a towel between your skin and the bag.  Leave the ice on for 20 minutes, 2-3 times a day. Use ice on as many days as told by your doctor.  Compression Compression means putting pressure on the injured area. This can be done with an elastic bandage. If an elastic bandage has been put on your injury:  Do not wrap the bandage too tight. Wrap the bandage more loosely if part of your body away from the bandage is blue, swollen, cold, painful, or loses feeling (gets numb).  Take off the bandage and put it on again. Do this every 3-4 hours or as told by your doctor.  See your doctor if the bandage  seems to make your problems worse.  Elevation Elevation means keeping the injured area raised. If you can, raise the injured area above your heart or the center of your chest. Contact a doctor if:  You keep having pain and swelling.  Your symptoms get worse. Get help right away if:  You have sudden bad pain at your injury or lower than your injury.  You have redness or more swelling around your injury.  You have tingling or numbness at your injury or lower than your injury, and it does not go away when you take off the bandage. Summary  Many injuries can be cared for using rest, ice, compression, and elevation (RICE therapy).  You can go back to your normal activities when you feel okay and your doctor says it is okay.  Put ice on the injured area as told by your doctor.  Get help if your symptoms get worse or if you keep having pain and swelling. This information is not intended to replace advice given to you by your health care provider. Make sure you discuss any questions you have with your health care provider. Document Released: 08/04/2007 Document Revised: 11/05/2016 Document Reviewed: 11/05/2016 Elsevier Patient Education  2020 [place] Contusion A foot contusion is a deep bruise to the foot. Deep bruises happen when an injury causes bleeding under the skin. The skin over the bruise  may turn blue, purple, or yellow. Minor injuries will cause a bruise that is painless. Deep bruises that are worse may stay painful and swollen for a few weeks. What are the causes? This condition is most often caused by a hard hit or direct force to your foot, such as having a heavy object fall on your foot. What are the signs or symptoms?   Swelling of the foot.  Pain and tenderness of the foot.  Changes in the color of the foot. The area may have redness and then turn blue, purple, or yellow. How is this treated? In general, the best treatment for a foot contusion is  rest, ice, pressure (compression), and elevation. This is often called RICE therapy. An elastic wrap may be recommended to support your foot.  Your doctor may also suggest over-the-counter medicines for pain control. If your swelling or pain is very bad, you may be given crutches. Follow these instructions at home: RICE therapy   Rest the injured area. Try to avoid standing or walking while your foot hurts.  If told, put ice on the injured area: ? Put ice in a plastic bag. ? Place a towel between your skin and the bag. ? Leave the ice on for 20 minutes, 2-3 times a day.  If told, put light pressure on the injured area using an elastic wrap. ? Make sure the wrap is not too tight. If your toes turn numb, cold, or blue, take the wrap off and put it back on more loosely. ? Remove and put the wrap back on as told by your doctor.  Raise (elevate) the injured area above the level of your heart while you are sitting or lying down. General instructions  Take over-the-counter and prescription medicines only as told by your doctor.  Use crutches as told by your doctor, if this applies.  Do not use any products that contain nicotine or tobacco, such as cigarettes, e-cigarettes, and chewing tobacco. These can delay healing. If you need help quitting, ask your doctor.  Keep all follow-up visits as told by your doctor. This is important. Contact a doctor if:  Your symptoms do not get better after many days of treatment.  You have redness, swelling, or pain in your foot or toes.  You have trouble moving the injured area.  Medicine does not help your swelling or pain. Get help right away if:  You have very bad pain.  Your foot or toes are numb.  Your foot or toes turn very light (pale) or cold.  You cannot move your foot or ankle.  Your foot feels warm when you touch it. Summary  A foot contusion is a deep bruise to the foot.  This condition is most often caused by a hard hit or  direct force to your foot.  Symptoms include swelling, pain, and color changes in the injured area.  In general, the best treatment for a foot contusion is rest, ice, pressure (compression), and elevation. This information is not intended to replace advice given to you by your health care provider. Make sure you discuss any questions you have with your health care provider. Document Released: 11/25/2007 Document Revised: 10/19/2017 Document Reviewed: 10/19/2017 Elsevier Patient Education  2020 [employer]

## 2018-09-15 NOTE — Progress Notes (Signed)
Patient ID: Felicia Curtis [name], female   DOB: 02-21-1996, 23 y.o.   MRN: [id number] CLINICAL DATA:  Crushing injury, pain  EXAM: RIGHT FOOT COMPLETE - 3+ VIEW  COMPARISON:  None.  FINDINGS: No fracture or dislocation of the right foot. Joint spaces are well preserved. Mild soft tissue edema of the forefoot.  IMPRESSION: No fracture or dislocation of the right foot. Joint spaces are well preserved. Mild soft tissue edema of the forefoot.  Provider called patient at 09/15/18 with results she verbalized understanding. Follow up per discharge instructions office visit.   Advised patient call the office or your primary care doctor for an appointment if no improvement within 72 hours or if any symptoms change or worsen at any time  Advised ER or urgent Care if after hours or on weekend. Call 911 for emergency symptoms at any time.Patinet verbalized understanding of all instructions given/reviewed and treatment plan and has no further questions or concerns at this time.

## 2018-12-06 ENCOUNTER — Other Ambulatory Visit: Payer: Self-pay

## 2018-12-06 ENCOUNTER — Ambulatory Visit: Payer: Managed Care, Other (non HMO)

## 2018-12-06 DIAGNOSIS — Z23 Encounter for immunization: Secondary | ICD-10-CM

## 2019-01-22 ENCOUNTER — Other Ambulatory Visit: Payer: Self-pay | Admitting: Family

## 2019-01-22 DIAGNOSIS — N946 Dysmenorrhea, unspecified: Secondary | ICD-10-CM

## 2019-01-22 DIAGNOSIS — R102 Pelvic and perineal pain: Secondary | ICD-10-CM

## 2019-01-31 ENCOUNTER — Telehealth: Payer: Self-pay | Admitting: Obstetrics and Gynecology

## 2019-01-31 NOTE — Telephone Encounter (Signed)
Error

## 2019-02-02 ENCOUNTER — Other Ambulatory Visit: Payer: Self-pay

## 2019-02-02 ENCOUNTER — Ambulatory Visit
Admission: RE | Admit: 2019-02-02 | Discharge: 2019-02-02 | Disposition: A | Discharge status: Home / Self Care | Payer: Managed Care, Other (non HMO) | Source: Ambulatory Visit | Attending: Family | Admitting: Family

## 2019-02-02 DIAGNOSIS — R102 Pelvic and perineal pain: Secondary | ICD-10-CM

## 2019-02-02 DIAGNOSIS — N946 Dysmenorrhea, unspecified: Secondary | ICD-10-CM | POA: Diagnosis present

## 2019-02-08 ENCOUNTER — Other Ambulatory Visit: Payer: Self-pay

## 2019-02-08 DIAGNOSIS — Z20822 Contact with and (suspected) exposure to covid-19: Secondary | ICD-10-CM

## 2019-02-10 ENCOUNTER — Ambulatory Visit: Payer: Self-pay

## 2019-02-10 LAB — NOVEL CORONAVIRUS, NAA: SARS-CoV-2, NAA: DETECTED — AB

## 2019-02-10 NOTE — Telephone Encounter (Signed)
Pt. Calling for COVID 19 results. See result note. 

## 2019-03-02 NOTE — L&D Delivery Note (Signed)
Delivery Summary for Lajuana Matte  Labor Events:   Preterm labor: No data found  Rupture date: No data found  Rupture time: No data found  Rupture type: No data found  Fluid Color: No data found  Induction: No data found  Augmentation: No data found  Complications: No data found  Cervical ripening: No data found No data found   No data found     Delivery:   Episiotomy: No data found  Lacerations: No data found  Repair suture: No data found  Repair # of packets: No data found  Blood loss (ml): 200   Information for the patient's newborn:  Keyshla, Tunison [[id number]]    Delivery 02/27/2020 3:57 PM by  Vaginal, Spontaneous Sex:  female Gestational Age: [redacted]w[redacted]d Delivery Clinician:   Living?:         APGARS  One minute Five minutes Ten minutes  Skin color:        Heart rate:        Grimace:        Muscle tone:        Breathing:        Totals: 8  9      Presentation/position:      Resuscitation:   Cord information:    Disposition of cord blood:     Blood gases sent?  Complications:   Placenta: Delivered:       appearance Newborn Measurements: Weight: 6 lb 15.1 oz (3150 g)  Height: 18.75"  Head circumference:    Chest circumference:    Other providers:    Additional  information: Forceps:   Vacuum:   Breech:   Observed anomalies         Delivery Note At 3:57 PM a viable and healthy female was delivered via Vaginal, Spontaneous (Presentation: Vertex; ROA position).  APGAR: 8, 9; weight 6 lb 15.1 oz (3150 g).   Placenta status: Spontaneous, Intact.  Cord: 3 vessels with the following complications: None.  Cord pH: not obtained.  Delayed cord clamping observed.   Anesthesia: Epidural Episiotomy: None Lacerations: Vaginal (left) Suture Repair: 3.0 vicryl Est. Blood Loss (mL):200    Mom to postpartum.  Baby to Couplet care / Skin to Skin.  Hildred Laser, MD 02/27/2020, 4:48 PM

## 2019-04-20 ENCOUNTER — Ambulatory Visit: Payer: Managed Care, Other (non HMO)

## 2019-04-23 ENCOUNTER — Ambulatory Visit: Payer: Managed Care, Other (non HMO) | Admitting: Nurse Practitioner

## 2019-04-23 ENCOUNTER — Other Ambulatory Visit: Payer: Self-pay

## 2019-04-23 VITALS — BP 112/62 | HR 61 | Temp 97.0°F | Resp 18 | Ht 66.0 in | Wt 190.0 lb

## 2019-04-23 DIAGNOSIS — Z008 Encounter for other general examination: Secondary | ICD-10-CM | POA: Diagnosis not present

## 2019-04-23 NOTE — Progress Notes (Signed)
Felicia Curtis is a 24 y.o. female who presents to the [facility] for her yearly Biometric screening exam. Felicia Curtis is employed at the [employer] department. She denies any problems today.    Medications: None Allergies: Sulfa drugs Social Hx: Denies use of tobacco or drugs. Occasional ETOH use. Denies any unusual stress or problems at this time.Enjoys walking and playing softball.   BP 112/62 (BP Location: Left Arm, Patient Position: Sitting, Cuff Size: Normal)   Pulse 61   Temp (!) 97 F (36.1 C)   Resp 18   Ht 5\' 6"  (1.676 m)   Wt 190 lb (86.2 kg)   SpO2 99%   BMI 30.67 kg/m    Review of Systems  Constitutional: Negative for chills and fever.  HENT:       Left ear itching  Eyes: Negative.   Respiratory: Negative for cough.   Cardiovascular: Negative for chest pain.  Gastrointestinal: Negative for abdominal pain.  Musculoskeletal: Negative for back pain.  Skin: Negative.    Physical Exam  Constitutional: She appears well-developed and well-nourished. No distress.  HENT:  Head: Normocephalic.  Right Ear: External ear normal.  Left Ear: External ear normal.  Nose: Nose normal.  Mouth/Throat: Oropharynx is clear and moist.  Eyes: Conjunctivae and EOM are normal.  Cardiovascular: Normal rate and regular rhythm.  Pulmonary/Chest: Effort normal and breath sounds normal.  Abdominal: There is no abdominal tenderness.  Musculoskeletal:        General: Normal range of motion.     Cervical back: Normal range of motion and neck supple.  Neurological: She is alert. She has normal strength and normal reflexes. She displays a negative Romberg sign. Gait normal.  Reflex Scores:      Bicep reflexes are 2+ on the right side and 2+ on the left side.      Brachioradialis reflexes are 2+ on the right side and 2+ on the left side.      Patellar reflexes are 2+ on the right side and 2+ on the left side. Stands on one foot without difficulty.  Skin: Skin is warm and dry.   Psychiatric: She has a normal mood and affect.   Assessment: 24 y.o. female here for Biometric assessment without problems today and normal exam.   Plan: Labs drawn, Discussed diet and exercise. Employee to f/u with her PCP or return to [facility] prn. Labs results will be available in "My Chart".

## 2019-04-24 LAB — LIPID PANEL
Chol/HDL Ratio: 4.3 ratio (ref 0.0–4.4)
Cholesterol, Total: 201 mg/dL — ABNORMAL HIGH (ref 100–199)
HDL: 47 mg/dL (ref >39)
LDL Chol Calc (NIH): 131 mg/dL — ABNORMAL HIGH (ref 0–99)
Triglycerides: 127 mg/dL (ref 0–149)
VLDL Cholesterol Cal: 23 mg/dL (ref 5–40)

## 2019-04-24 LAB — GLUCOSE, RANDOM: Glucose: 84 mg/dL (ref 65–99)

## 2019-06-20 ENCOUNTER — Telehealth: Payer: Self-pay

## 2019-06-20 ENCOUNTER — Telehealth: Payer: Self-pay | Admitting: Certified Nurse Midwife

## 2019-06-20 NOTE — Telephone Encounter (Signed)
mychart message sent to patient

## 2019-06-20 NOTE — Telephone Encounter (Signed)
Pt called in and stated that she has a nurse visit in may 18th  and that she is 5 weeks. This past weekend she was playing softball she did slide on the ground on her belly. She wants to know is that okay. She is a 1st time parent.  Please advise

## 2019-07-16 ENCOUNTER — Telehealth: Payer: Self-pay

## 2019-07-16 ENCOUNTER — Telehealth: Payer: Self-pay | Admitting: Certified Nurse Midwife

## 2019-07-16 NOTE — Telephone Encounter (Signed)
Patient called in saying she went to the bathroom and went to wipe and there was light blood. Patient states that she doesn't know if it was from her straining because patient states she does have hemrroids. Patient denies abdominal pain/cramping. Could you please advise?

## 2019-07-16 NOTE — Telephone Encounter (Signed)
mychart message sent to patient

## 2019-07-20 ENCOUNTER — Other Ambulatory Visit: Payer: Self-pay

## 2019-07-20 ENCOUNTER — Ambulatory Visit (INDEPENDENT_AMBULATORY_CARE_PROVIDER_SITE_OTHER): Payer: Managed Care, Other (non HMO) | Admitting: Surgical

## 2019-07-20 VITALS — BP 114/68 | HR 66 | Ht 66.0 in | Wt 195.0 lb

## 2019-07-20 DIAGNOSIS — Z3491 Encounter for supervision of normal pregnancy, unspecified, first trimester: Secondary | ICD-10-CM

## 2019-07-20 NOTE — Progress Notes (Signed)
Felicia Curtis presents for NOB nurse interview visit. Pregnancy confirmation done 06/18/2019. G1. P0. Pregnancy education material explained and given. 0 cats in home. NOB labs ordered. TSH/HbgA1c ordered due to BMI 30 or greater.  HIV labs and drug screen were explained and ordered. PNV encouraged. Genetic screening options discussed. Genetic testing: Unsure. Patient may discuss with the provider. Patient to follow up with provider in  2 weeks for NOB physical. All questions answered.

## 2019-07-21 LAB — ABO AND RH: Rh Factor: POSITIVE

## 2019-07-21 LAB — TSH: TSH: 1.74 u[IU]/mL (ref 0.450–4.500)

## 2019-07-21 LAB — RPR: RPR Ser Ql: NONREACTIVE

## 2019-07-21 LAB — VARICELLA ZOSTER ANTIBODY, IGG: Varicella zoster IgG: 2890 index (ref >165)

## 2019-07-21 LAB — URINALYSIS, ROUTINE W REFLEX MICROSCOPIC
Bilirubin, UA: NEGATIVE
Glucose, UA: NEGATIVE
Ketones, UA: NEGATIVE
Nitrite, UA: NEGATIVE
Protein,UA: NEGATIVE
RBC, UA: NEGATIVE
Specific Gravity, UA: 1.008 (ref 1.005–1.030)
Urobilinogen, Ur: 0.2 mg/dL (ref 0.2–1.0)
pH, UA: 7.5 (ref 5.0–7.5)

## 2019-07-21 LAB — ANTIBODY SCREEN: Antibody Screen: NEGATIVE

## 2019-07-21 LAB — HEPATITIS B SURFACE ANTIGEN: Hepatitis B Surface Ag: NEGATIVE

## 2019-07-21 LAB — MICROSCOPIC EXAMINATION
Bacteria, UA: NONE SEEN
Casts: NONE SEEN /lpf
RBC, Urine: NONE SEEN /hpf (ref 0–2)
WBC, UA: NONE SEEN /hpf (ref 0–5)

## 2019-07-21 LAB — RUBELLA SCREEN: Rubella Antibodies, IGG: 1.11 index (ref >0.99)

## 2019-07-21 LAB — HEMOGLOBIN A1C
Est. average glucose Bld gHb Est-mCnc: 103 mg/dL
Hgb A1c MFr Bld: 5.2 % (ref 4.8–5.6)

## 2019-07-21 LAB — HIV ANTIBODY (ROUTINE TESTING W REFLEX): HIV Screen 4th Generation wRfx: NONREACTIVE

## 2019-07-22 LAB — CULTURE, OB URINE

## 2019-07-22 LAB — URINE CULTURE, OB REFLEX: Organism ID, Bacteria: NO GROWTH

## 2019-07-23 LAB — MONITOR DRUG PROFILE 14(MW)
Amphetamine Scrn, Ur: NEGATIVE ng/mL
BARBITURATE SCREEN URINE: NEGATIVE ng/mL
BENZODIAZEPINE SCREEN, URINE: NEGATIVE ng/mL
Buprenorphine, Urine: NEGATIVE ng/mL
CANNABINOIDS UR QL SCN: NEGATIVE ng/mL
Cocaine (Metab) Scrn, Ur: NEGATIVE ng/mL
Creatinine(Crt), U: 36.3 mg/dL (ref 20.0–300.0)
Fentanyl, Urine: NEGATIVE pg/mL
Meperidine Screen, Urine: NEGATIVE ng/mL
Methadone Screen, Urine: NEGATIVE ng/mL
OXYCODONE+OXYMORPHONE UR QL SCN: NEGATIVE ng/mL
Opiate Scrn, Ur: NEGATIVE ng/mL
Ph of Urine: 7.3 (ref 4.5–8.9)
Phencyclidine Qn, Ur: NEGATIVE ng/mL
Propoxyphene Scrn, Ur: NEGATIVE ng/mL
SPECIFIC GRAVITY: 1.006
Tramadol Screen, Urine: NEGATIVE ng/mL

## 2019-07-23 LAB — NICOTINE SCREEN, URINE: Cotinine Ql Scrn, Ur: NEGATIVE ng/mL

## 2019-07-23 LAB — GC/CHLAMYDIA PROBE AMP
Chlamydia trachomatis, NAA: NEGATIVE
Neisseria Gonorrhoeae by PCR: NEGATIVE

## 2019-07-24 ENCOUNTER — Other Ambulatory Visit: Payer: Self-pay

## 2019-07-24 ENCOUNTER — Telehealth: Payer: Self-pay | Admitting: Obstetrics and Gynecology

## 2019-07-24 ENCOUNTER — Ambulatory Visit (INDEPENDENT_AMBULATORY_CARE_PROVIDER_SITE_OTHER): Payer: Managed Care, Other (non HMO)

## 2019-07-24 DIAGNOSIS — Z3A09 9 weeks gestation of pregnancy: Secondary | ICD-10-CM | POA: Diagnosis not present

## 2019-07-24 DIAGNOSIS — Z3491 Encounter for supervision of normal pregnancy, unspecified, first trimester: Secondary | ICD-10-CM

## 2019-07-24 NOTE — Telephone Encounter (Signed)
Patient came in today for her dating ultrasound, went to check out patient and she was confused because she thought she was also going to be getting the genetic testing done at this appointment and I informed her that she wasn't on the schedule for any labs. I informed patient she could ask the provider when she comes to her NEW OB physical in June, however she said that she was told by the nurse she saw for her intake that she could have it done today. Patient asked if we could get her on the schedule for genetic testing today that she would like to be called and placed on the schedule that she didn't want to wait until her NOB appt.   Please advise.

## 2019-07-24 NOTE — Telephone Encounter (Signed)
Spoke with patient and she stated that the [place] tech told her that she was only 9 weeks 3 days. I told the patient that she was to early to have the genetic testing. I explained that we got the [place] to see exactly how far along she was. If we get the test when she is to early we will have to repeat. Patient verbalized understanding. We will get at new OB physical.

## 2019-08-01 ENCOUNTER — Telehealth: Payer: Self-pay | Admitting: Obstetrics and Gynecology

## 2019-08-01 NOTE — Telephone Encounter (Signed)
Pt called in and stated that she is a little confused about her ob payment plan. I told the pt that we bill for a vaginal delivery and that you insurance covers 70%, you are reasonable for 30%. That makes your balance $749.00 we broke the payments into 5 of your visit of $149.80 and if you have a u/s its $70.00 for that visit. The pt was told that she will be billed after she delivers at the hospital from the hospital. The pt verbal understood.

## 2019-08-02 ENCOUNTER — Ambulatory Visit (INDEPENDENT_AMBULATORY_CARE_PROVIDER_SITE_OTHER): Payer: Managed Care, Other (non HMO) | Admitting: Obstetrics and Gynecology

## 2019-08-02 ENCOUNTER — Other Ambulatory Visit: Payer: Self-pay

## 2019-08-02 ENCOUNTER — Encounter: Payer: Self-pay | Admitting: Obstetrics and Gynecology

## 2019-08-02 VITALS — BP 112/76 | HR 74 | Wt 194.5 lb

## 2019-08-02 DIAGNOSIS — Z3491 Encounter for supervision of normal pregnancy, unspecified, first trimester: Secondary | ICD-10-CM

## 2019-08-02 DIAGNOSIS — Z3A11 11 weeks gestation of pregnancy: Secondary | ICD-10-CM

## 2019-08-02 LAB — POCT URINALYSIS DIPSTICK OB
Bilirubin, UA: NEGATIVE
Blood, UA: NEGATIVE
Glucose, UA: NEGATIVE
Ketones, UA: NEGATIVE
Leukocytes, UA: NEGATIVE
Nitrite, UA: NEGATIVE
POC,PROTEIN,UA: NEGATIVE
Spec Grav, UA: 1.02 (ref 1.010–1.025)
Urobilinogen, UA: 0.2 E.U./dL
pH, UA: 8 (ref 5.0–8.0)

## 2019-08-02 NOTE — Progress Notes (Signed)
NOB: Patient has no complaints.  Desires genetic testing-maternity 21 today.  aFP next visit  Physical examination General NAD, Conversant  HEENT Atraumatic; Op clear with mmm.  Normo-cephalic. Pupils reactive. Anicteric sclerae  Thyroid/Neck Smooth without nodularity or enlargement. Normal ROM.  Neck Supple.  Skin No rashes, lesions or ulceration. Normal palpated skin turgor. No nodularity.  Breasts: No masses or discharge.  Symmetric.  No axillary adenopathy.  Lungs: Clear to auscultation.No rales or wheezes. Normal Respiratory effort, no retractions.  Heart: NSR.  No murmurs or rubs appreciated. No periferal edema  Abdomen: Soft.  Non-tender.  No masses.  No HSM. No hernia  Extremities: Moves all appropriately.  Normal ROM for age. No lymphadenopathy.  Neuro: Oriented to PPT.  Normal mood. Normal affect.     Pelvic:   Vulva: Normal appearance.  No lesions.  Vagina: No lesions or abnormalities noted.  Support: Normal pelvic support.  Urethra No masses tenderness or scarring.  Meatus Normal size without lesions or prolapse.  Cervix: Normal appearance.  No lesions.  Anus: Normal exam.  No lesions.  Perineum: Normal exam.  No lesions.        Bimanual   Adnexae: No masses.  Non-tender to palpation.  Uterus: Enlarged. 12weeks  Non-tender.  Mobile.  AV.  Adnexae: No masses.  Non-tender to palpation.  Cul-de-sac: Negative for abnormality.  Adnexae: No masses.  Non-tender to palpation.         Pelvimetry   Diagonal: Reached.  Spines: Average.  Sacrum: Concave.  Pubic Arch: Normal.

## 2019-08-06 LAB — MATERNIT21  PLUS CORE+ESS+SCA, BLOOD

## 2019-09-04 ENCOUNTER — Encounter: Payer: Self-pay | Admitting: Obstetrics and Gynecology

## 2019-09-04 ENCOUNTER — Other Ambulatory Visit: Payer: Self-pay

## 2019-09-04 ENCOUNTER — Ambulatory Visit (INDEPENDENT_AMBULATORY_CARE_PROVIDER_SITE_OTHER): Payer: Managed Care, Other (non HMO) | Admitting: Obstetrics and Gynecology

## 2019-09-04 VITALS — BP 116/80 | HR 101 | Wt 194.7 lb

## 2019-09-04 DIAGNOSIS — Z3A16 16 weeks gestation of pregnancy: Secondary | ICD-10-CM

## 2019-09-04 DIAGNOSIS — Z1379 Encounter for other screening for genetic and chromosomal anomalies: Secondary | ICD-10-CM

## 2019-09-04 DIAGNOSIS — Z3402 Encounter for supervision of normal first pregnancy, second trimester: Secondary | ICD-10-CM

## 2019-09-04 DIAGNOSIS — M5431 Sciatica, right side: Secondary | ICD-10-CM

## 2019-09-04 LAB — POCT URINALYSIS DIPSTICK OB
Bilirubin, UA: NEGATIVE
Blood, UA: NEGATIVE
Glucose, UA: NEGATIVE
Ketones, UA: NEGATIVE
Leukocytes, UA: NEGATIVE
Nitrite, UA: NEGATIVE
POC,PROTEIN,UA: NEGATIVE
Spec Grav, UA: 1.025 (ref 1.010–1.025)
Urobilinogen, UA: 0.2 E.U./dL
pH, UA: 6 (ref 5.0–8.0)

## 2019-09-04 NOTE — Progress Notes (Signed)
ROB: Patient complains of right sciatic pain, worsening (since ~ 6 weeks of pregnancy). Discussed stretches in pregnancy, can refer to pelvic floor physical therapy if it continues to worsen. Notes not feeling very pregnant, given reassurance. Prenatal labs reviewed. For spina bifida screen today. RTC in 4 weeks, for anatomy scan at that time.

## 2019-09-04 NOTE — Progress Notes (Signed)
ROB-Pt present for routine prenatal care. Pt stated having severe back pain and light cramping in the abd area. Pt denies feeling the baby move and stated that she do not feel pregnancy anymore.

## 2019-09-04 NOTE — Patient Instructions (Addendum)
Second Trimester of Pregnancy The second trimester is from week 14 through week 27 (months 4 through 6). The second trimester is often a time when you feel your best. Your body has adjusted to being pregnant, and you begin to feel better physically. Usually, morning sickness has lessened or quit completely, you may have more energy, and you may have an increase in appetite. The second trimester is also a time when the fetus is growing rapidly. At the end of the sixth month, the fetus is about 9 inches long and weighs about 1 pounds. You will likely begin to feel the baby move (quickening) between 16 and 20 weeks of pregnancy. Body changes during your second trimester Your body continues to go through many changes during your second trimester. The changes vary from woman to woman.  Your weight will continue to increase. You will notice your lower abdomen bulging out.  You may begin to get stretch marks on your hips, abdomen, and breasts.  You may develop headaches that can be relieved by medicines. The medicines should be approved by your health care provider.  You may urinate more often because the fetus is pressing on your bladder.  You may develop or continue to have heartburn as a result of your pregnancy.  You may develop constipation because certain hormones are causing the muscles that push waste through your intestines to slow down.  You may develop hemorrhoids or swollen, bulging veins (varicose veins).  You may have back pain. This is caused by: ? Weight gain. ? Pregnancy hormones that are relaxing the joints in your pelvis. ? A shift in weight and the muscles that support your balance.  Your breasts will continue to grow and they will continue to become tender.  Your gums may bleed and may be sensitive to brushing and flossing.  Dark spots or blotches (chloasma, mask of pregnancy) may develop on your face. This will likely fade after the baby is born.  A dark line from your  belly button to the pubic area (linea nigra) may appear. This will likely fade after the baby is born.  You may have changes in your hair. These can include thickening of your hair, rapid growth, and changes in texture. Some women also have hair loss during or after pregnancy, or hair that feels dry or thin. Your hair will most likely return to normal after your baby is born. What to expect at prenatal visits During a routine prenatal visit:  You will be weighed to make sure you and the fetus are growing normally.  Your blood pressure will be taken.  Your abdomen will be measured to track your baby's growth.  The fetal heartbeat will be listened to.  Any test results from the previous visit will be discussed. Your health care provider may ask you:  How you are feeling.  If you are feeling the baby move.  If you have had any abnormal symptoms, such as leaking fluid, bleeding, severe headaches, or abdominal cramping.  If you are using any tobacco products, including cigarettes, chewing tobacco, and electronic cigarettes.  If you have any questions. Other tests that may be performed during your second trimester include:  Blood tests that check for: ? Low iron levels (anemia). ? High blood sugar that affects pregnant women (gestational diabetes) between 24 and 28 weeks. ? Rh antibodies. This is to check for a protein on red blood cells (Rh factor).  Urine tests to check for infections, diabetes, or protein in the   urine.  An ultrasound to confirm the proper growth and development of the baby.  An amniocentesis to check for possible genetic problems.  Fetal screens for spina bifida and Down syndrome.  HIV (human immunodeficiency virus) testing. Routine prenatal testing includes screening for HIV, unless you choose not to have this test. Follow these instructions at home: Medicines  Follow your health care provider's instructions regarding medicine use. Specific medicines may be  either safe or unsafe to take during pregnancy.  Take a prenatal vitamin that contains at least 600 micrograms (mcg) of folic acid.  If you develop constipation, try taking a stool softener if your health care provider approves. Eating and drinking   Eat a balanced diet that includes fresh fruits and vegetables, whole grains, good sources of protein such as meat, eggs, or tofu, and low-fat dairy. Your health care provider will help you determine the amount of weight gain that is right for you.  Avoid raw meat and uncooked cheese. These carry germs that can cause birth defects in the baby.  If you have low calcium intake from food, talk to your health care provider about whether you should take a daily calcium supplement.  Limit foods that are high in fat and processed sugars, such as fried and sweet foods.  To prevent constipation: ? Drink enough fluid to keep your urine clear or pale yellow. ? Eat foods that are high in fiber, such as fresh fruits and vegetables, whole grains, and beans. Activity  Exercise only as directed by your health care provider. Most women can continue their usual exercise routine during pregnancy. Try to exercise for 30 minutes at least 5 days a week. Stop exercising if you experience uterine contractions.  Avoid heavy lifting, wear low heel shoes, and practice good posture.  A sexual relationship may be continued unless your health care provider directs you otherwise. Relieving pain and discomfort  Wear a good support bra to prevent discomfort from breast tenderness.  Take warm sitz baths to soothe any pain or discomfort caused by hemorrhoids. Use hemorrhoid cream if your health care provider approves.  Rest with your legs elevated if you have leg cramps or low back pain.  If you develop varicose veins, wear support hose. Elevate your feet for 15 minutes, 3-4 times a day. Limit salt in your diet. Prenatal Care  Write down your questions. Take them to  your prenatal visits.  Keep all your prenatal visits as told by your health care provider. This is important. Safety  Wear your seat belt at all times when driving.  Make a list of emergency phone numbers, including numbers for family, friends, the hospital, and police and fire departments. General instructions  Ask your health care provider for a referral to a local prenatal education class. Begin classes no later than the beginning of month 6 of your pregnancy.  Ask for help if you have counseling or nutritional needs during pregnancy. Your health care provider can offer advice or refer you to specialists for help with various needs.  Do not use hot tubs, steam rooms, or saunas.  Do not douche or use tampons or scented sanitary pads.  Do not cross your legs for long periods of time.  Avoid cat litter boxes and soil used by cats. These carry germs that can cause birth defects in the baby and possibly loss of the fetus by miscarriage or stillbirth.  Avoid all smoking, herbs, alcohol, and unprescribed drugs. Chemicals in these products can affect the formation   and growth of the baby.  Do not use any products that contain nicotine or tobacco, such as cigarettes and e-cigarettes. If you need help quitting, ask your health care provider.  Visit your dentist if you have not gone yet during your pregnancy. Use a soft toothbrush to brush your teeth and be gentle when you floss. Contact a health care provider if:  You have dizziness.  You have mild pelvic cramps, pelvic pressure, or nagging pain in the abdominal area.  You have persistent nausea, vomiting, or diarrhea.  You have a bad smelling vaginal discharge.  You have pain when you urinate. Get help right away if:  You have a fever.  You are leaking fluid from your vagina.  You have spotting or bleeding from your vagina.  You have severe abdominal cramping or pain.  You have rapid weight gain or weight loss.  You have  shortness of breath with chest pain.  You notice sudden or extreme swelling of your face, hands, ankles, feet, or legs.  You have not felt your baby move in over an hour.  You have severe headaches that do not go away when you take medicine.  You have vision changes. Summary  The second trimester is from week 14 through week 27 (months 4 through 6). It is also a time when the fetus is growing rapidly.  Your body goes through many changes during pregnancy. The changes vary from woman to woman.  Avoid all smoking, herbs, alcohol, and unprescribed drugs. These chemicals affect the formation and growth your baby.  Do not use any tobacco products, such as cigarettes, chewing tobacco, and e-cigarettes. If you need help quitting, ask your health care provider.  Contact your health care provider if you have any questions. Keep all prenatal visits as told by your health care provider. This is important. This information is not intended to replace advice given to you by your health care provider. Make sure you discuss any questions you have with your health care provider. Document Revised: 06/09/2018 Document Reviewed: 03/23/2016 Elsevier Patient Education  2020 [employer]   Sciatica  Sciatica is pain, numbness, weakness, or tingling along the path of the sciatic nerve. The sciatic nerve starts in the lower back and runs down the back of each leg. The nerve controls the muscles in the lower leg and in the back of the knee. It also provides feeling (sensation) to the back of the thigh, the lower leg, and the sole of the foot. Sciatica is a symptom of another medical condition that pinches or puts pressure on the sciatic nerve. Sciatica most often only affects one side of the body. Sciatica usually goes away on its own or with treatment. In some cases, sciatica may come back (recur). What are the causes? This condition is caused by pressure on the sciatic nerve or pinching of the nerve. This  may be the result of:  A disk in between the bones of the spine bulging out too far (herniated disk).  Age-related changes in the spinal disks.  A pain disorder that affects a muscle in the buttock.  Extra bone growth near the sciatic nerve.  A break (fracture) of the pelvis.  Pregnancy.  Tumor. This is rare. What increases the risk? The following factors may make you more likely to develop this condition:  Playing sports that place pressure or stress on the spine.  Having poor strength and flexibility.  A history of back injury or surgery.  Sitting for long  periods of time.  Doing activities that involve repetitive bending or lifting.  Obesity. What are the signs or symptoms? Symptoms can vary from mild to very severe, and they may include:  Any of these problems in the lower back, leg, hip, or buttock: ? Mild tingling, numbness, or dull aches. ? Burning sensations. ? Sharp pains.  Numbness in the back of the calf or the sole of the foot.  Leg weakness.  Severe back pain that makes movement difficult. Symptoms may get worse when you cough, sneeze, or laugh, or when you sit or stand for long periods of time. How is this diagnosed? This condition may be diagnosed based on:  Your symptoms and medical history.  A physical exam.  Blood tests.  Imaging tests, such as: ? X-rays. ? MRI. ? CT scan. How is this treated? In many cases, this condition improves on its own without treatment. However, treatment may include:  Reducing or modifying physical activity.  Exercising and stretching.  Icing and applying heat to the affected area.  Medicines that help to: ? Relieve pain and swelling. ? Relax your muscles.  Injections of medicines that help to relieve pain, irritation, and inflammation around the sciatic nerve (steroids).  Surgery. Follow these instructions at home: Medicines  Take over-the-counter and prescription medicines only as told by your  health care provider.  Ask your health care provider if the medicine prescribed to you: ? Requires you to avoid driving or using heavy machinery. ? Can cause constipation. You may need to take these actions to prevent or treat constipation:  Drink enough fluid to keep your urine pale yellow.  Take over-the-counter or prescription medicines.  Eat foods that are high in fiber, such as beans, whole grains, and fresh fruits and vegetables.  Limit foods that are high in fat and processed sugars, such as fried or sweet foods. Managing pain      If directed, put ice on the affected area. ? Put ice in a plastic bag. ? Place a towel between your skin and the bag. ? Leave the ice on for 20 minutes, 2-3 times a day.  If directed, apply heat to the affected area. Use the heat source that your health care provider recommends, such as a moist heat pack or a heating pad. ? Place a towel between your skin and the heat source. ? Leave the heat on for 20-30 minutes. ? Remove the heat if your skin turns bright red. This is especially important if you are unable to feel pain, heat, or cold. You may have a greater risk of getting burned. Activity   Return to your normal activities as told by your health care provider. Ask your health care provider what activities are safe for you.  Avoid activities that make your symptoms worse.  Take brief periods of rest throughout the day. ? When you rest for longer periods, mix in some mild activity or stretching between periods of rest. This will help to prevent stiffness and pain. ? Avoid sitting for long periods of time without moving. Get up and move around at least one time each hour.  Exercise and stretch regularly, as told by your health care provider.  Do not lift anything that is heavier than 10 lb (4.5 kg) while you have symptoms of sciatica. When you do not have symptoms, you should still avoid heavy lifting, especially repetitive heavy  lifting.  When you lift objects, always use proper lifting technique, which includes: ? Bending your knees. ?  Keeping the load close to your body. ? Avoiding twisting. General instructions  Maintain a healthy weight. Excess weight puts extra stress on your back.  Wear supportive, comfortable shoes. Avoid wearing high heels.  Avoid sleeping on a mattress that is too soft or too hard. A mattress that is firm enough to support your back when you sleep may help to reduce your pain.  Keep all follow-up visits as told by your health care provider. This is important. Contact a health care provider if:  You have pain that: ? Wakes you up when you are sleeping. ? Gets worse when you lie down. ? Is worse than you have experienced in the past. ? Lasts longer than 4 weeks.  You have an unexplained weight loss. Get help right away if:  You are not able to control when you urinate or have bowel movements (incontinence).  You have: ? Weakness in your lower back, pelvis, buttocks, or legs that gets worse. ? Redness or swelling of your back. ? A burning sensation when you urinate. Summary  Sciatica is pain, numbness, weakness, or tingling along the path of the sciatic nerve.  This condition is caused by pressure on the sciatic nerve or pinching of the nerve.  Sciatica can cause pain, numbness, or tingling in the lower back, legs, hips, and buttocks.  Treatment often includes rest, exercise, medicines, and applying ice or heat. This information is not intended to replace advice given to you by your health care provider. Make sure you discuss any questions you have with your health care provider. Document Revised: 03/06/2018 Document Reviewed: 03/06/2018 Elsevier Patient Education  2020 [employer].  Commonly Asked Questions During Pregnancy  Cats: A parasite can be excreted in cat feces.  To avoid exposure you need to have another person empty the little box.  If you must empty the  litter box you will need to wear gloves.  Wash your hands after handling your cat.  This parasite can also be found in raw or undercooked meat so this should also be avoided.  Colds, Sore Throats, Flu: Please check your medication sheet to see what you can take for symptoms.  If your symptoms are unrelieved by these medications please call the office.  Dental Work: Most any dental work Agricultural consultant recommends is permitted.  X-rays should only be taken during the first trimester if absolutely necessary.  Your abdomen should be shielded with a lead apron during all x-rays.  Please notify your provider prior to receiving any x-rays.  Novocaine is fine; gas is not recommended.  If your dentist requires a note from [place] prior to dental work please call the office and we will provide one for you.  Exercise: Exercise is an important part of staying healthy during your pregnancy.  You may continue most exercises you were accustomed to prior to pregnancy.  Later in your pregnancy you will most likely notice you have difficulty with activities requiring balance like riding a bicycle.  It is important that you listen to your body and avoid activities that put you at a higher risk of falling.  Adequate rest and staying well hydrated are a must!  If you have questions about the safety of specific activities ask your provider.    Exposure to Children with illness: Try to avoid obvious exposure; report any symptoms to [place] when noted,  If you have chicken pos, red measles or mumps, you should be immune to these diseases.   Please do not take  any vaccines while pregnant unless you have checked with your OB provider.  Fetal Movement: After 28 weeks we recommend you do "kick counts" twice daily.  Lie or sit down in a calm quiet environment and count your baby movements "kicks".  You should feel your baby at least 10 times per hour.  If you have not felt 10 kicks within the first hour get up, walk around and have something sweet  to eat or drink then repeat for an additional hour.  If count remains less than 10 per hour notify your provider.  Fumigating: Follow your pest control agent's advice as to how long to stay out of your home.  Ventilate the area well before re-entering.  Hemorrhoids:   Most over-the-counter preparations can be used during pregnancy.  Check your medication to see what is safe to use.  It is important to use a stool softener or fiber in your diet and to drink lots of liquids.  If hemorrhoids seem to be getting worse please call the office.   Hot Tubs:  Hot tubs Jacuzzis and saunas are not recommended while pregnant.  These increase your internal body temperature and should be avoided.  Intercourse:  Sexual intercourse is safe during pregnancy as long as you are comfortable, unless otherwise advised by your provider.  Spotting may occur after intercourse; report any bright red bleeding that is heavier than spotting.  Labor:  If you know that you are in labor, please go to the hospital.  If you are unsure, please call the office and let us help you decide what to do.  Lifting, straining, etc:  If your job requires heavy lifting or straining please check with your provider for any limitations.  Generally, you should not lift items heavier than that you can lift simply with your hands and arms (no back muscles)  Painting:  Paint fumes do not harm your pregnancy, but may make you ill and should be avoided if possible.  Latex or water based paints have less odor than oils.  Use adequate ventilation while painting.  Permanents & Hair Color:  Chemicals in hair dyes are not recommended as they cause increase hair dryness which can increase hair loss during pregnancy.  " Highlighting" and permanents are allowed.  Dye may be absorbed differently and permanents may not hold as well during pregnancy.  Sunbathing:  Use a sunscreen, as skin burns easily during pregnancy.  Drink plenty of fluids; avoid over  heating.  Tanning Beds:  Because their possible side effects are still unknown, tanning beds are not recommended.  Ultrasound Scans:  Routine ultrasounds are performed at approximately 20 weeks.  You will be able to see your baby's general anatomy an if you would like to know the gender this can usually be determined as well.  If it is questionable when you conceived you may also receive an ultrasound early in your pregnancy for dating purposes.  Otherwise ultrasound exams are not routinely performed unless there is a medical necessity.  Although you can request a scan we ask that you pay for it when conducted because insurance does not cover " patient request" scans.  Work: If your pregnancy proceeds without complications you may work until your due date, unless your physician or employer advises otherwise.  Round Ligament Pain/Pelvic Discomfort:  Sharp, shooting pains not associated with bleeding are fairly common, usually occurring in the second trimester of pregnancy.  They tend to be worse when standing up or when you remain standing  for long periods of time.  These are the result of pressure of certain pelvic ligaments called "round ligaments".  Rest, Tylenol and heat seem to be the most effective relief.  As the womb and fetus grow, they rise out of the pelvis and the discomfort improves.  Please notify the office if your pain seems different than that described.  It may represent a more serious condition.

## 2019-09-06 LAB — AFP, SERUM, OPEN SPINA BIFIDA
AFP MoM: 1.6
AFP Value: 42.5 ng/mL
Gest. Age on Collection Date: 15.3 weeks
Maternal Age At EDD: 24.7 yr
OSBR Risk 1 IN: 2100
Test Results:: NEGATIVE
Weight: 195 [lb_av]

## 2019-10-02 ENCOUNTER — Ambulatory Visit (INDEPENDENT_AMBULATORY_CARE_PROVIDER_SITE_OTHER): Payer: Managed Care, Other (non HMO) | Admitting: Obstetrics and Gynecology

## 2019-10-02 ENCOUNTER — Ambulatory Visit (INDEPENDENT_AMBULATORY_CARE_PROVIDER_SITE_OTHER): Payer: Managed Care, Other (non HMO)

## 2019-10-02 ENCOUNTER — Encounter: Payer: Self-pay | Admitting: Obstetrics and Gynecology

## 2019-10-02 VITALS — BP 111/70 | HR 81 | Wt 196.5 lb

## 2019-10-02 DIAGNOSIS — Z3A19 19 weeks gestation of pregnancy: Secondary | ICD-10-CM

## 2019-10-02 DIAGNOSIS — Z3402 Encounter for supervision of normal first pregnancy, second trimester: Secondary | ICD-10-CM | POA: Diagnosis not present

## 2019-10-02 NOTE — Progress Notes (Signed)
ROB: Had FAS today.  Patient complains of occasional (3 times) severe heartburn especially after eating which causes significant midline upper abdominal/chest pain.  It has caused her to vomit as well.  She has tried [employer] and these "help a little". (We had a long discussion regarding the possibility of gallbladder disease.  Based on her description this seems more likely to me than heartburn.  Will consider work-up if frequency of attacks increases and low-fat diet does not help) Not feeling the baby move yet. Patient does occasionally have leg cramps.  We have discussed the increase of potassium in her diet.

## 2019-10-09 ENCOUNTER — Telehealth: Payer: Self-pay | Admitting: Certified Nurse Midwife

## 2019-10-09 NOTE — Telephone Encounter (Signed)
error 

## 2019-10-23 ENCOUNTER — Telehealth: Payer: Self-pay | Admitting: Obstetrics and Gynecology

## 2019-10-23 NOTE — Telephone Encounter (Signed)
Patient called in today and stated that she received a refund for one of her OB payment plans and that she called her insurance company and that they told her she had been overpaying. Patient states she has an upcoming appointment and doesn't want to pay the OB payment if it's just going to be refunded as an overpayment. Patient states she called on 10/09/2019 and has not heard anything back. Informed her that I didn't see where a previous telephone encounter had be sent and apologized that I was sending the message to someone who could be of better assistance. I informed patient to allow staff 24-48 hours to respond, patient verbalized understanding.

## 2019-10-24 NOTE — Telephone Encounter (Signed)
Pt aware I am unsure why she was given a refund.   Reached out to [facility] for advise. [facility] [place] unsure has well. Stacy to reach out to [place] and give me f/u today.

## 2019-10-30 NOTE — Telephone Encounter (Signed)
  Below is the response from billing(Stacy). Pt is aware. I have added an additional ob payment to her plan. Documented on plan and made TC aware.    Hey Jaynie Hitch, I talked to [place] about the payment.  The refund the patient received was in error.  It was mistakenly processed.  She said she plans to discuss with her team at their next meeting as a reminder about those pre-payments.

## 2019-10-31 ENCOUNTER — Encounter: Payer: Self-pay | Admitting: Obstetrics and Gynecology

## 2019-10-31 ENCOUNTER — Ambulatory Visit (INDEPENDENT_AMBULATORY_CARE_PROVIDER_SITE_OTHER): Payer: Managed Care, Other (non HMO) | Admitting: Obstetrics and Gynecology

## 2019-10-31 ENCOUNTER — Other Ambulatory Visit: Payer: Self-pay

## 2019-10-31 VITALS — BP 113/77 | HR 85 | Wt 202.4 lb

## 2019-10-31 DIAGNOSIS — Z3A23 23 weeks gestation of pregnancy: Secondary | ICD-10-CM | POA: Diagnosis not present

## 2019-10-31 DIAGNOSIS — R1013 Epigastric pain: Secondary | ICD-10-CM

## 2019-10-31 DIAGNOSIS — Z3402 Encounter for supervision of normal first pregnancy, second trimester: Secondary | ICD-10-CM

## 2019-10-31 DIAGNOSIS — Z131 Encounter for screening for diabetes mellitus: Secondary | ICD-10-CM

## 2019-10-31 LAB — POCT URINALYSIS DIPSTICK OB
Bilirubin, UA: NEGATIVE
Blood, UA: NEGATIVE
Glucose, UA: NEGATIVE
Ketones, UA: NEGATIVE
Nitrite, UA: NEGATIVE
POC,PROTEIN,UA: NEGATIVE
Spec Grav, UA: <=1.005 — AB (ref 1.010–1.025)
Urobilinogen, UA: 0.2 E.U./dL
pH, UA: 6.5 (ref 5.0–8.0)

## 2019-10-31 NOTE — Progress Notes (Signed)
ROB: Patient reports having several issues with her heartburn/possible gallbladder colic but is getting a little better.  Is trying to modify diet (low carb/low fat).  Patient also inquires about traveling during the holidays, will be entering 8th month of pregnancy around Thanksgiving and usually travels to [place].  Advised that she could likely travel, but should stop every 2-3 hrs to stretch to prevent blood clots. RTC in 4 weeks, for 28 week labs at that time.

## 2019-10-31 NOTE — Progress Notes (Signed)
ROB-pt present for routine prenatal care. Pt c/o of having hemorrhiods and bleeding from the rectum area once.

## 2019-11-01 ENCOUNTER — Observation Stay
Admission: EM | Admit: 2019-11-01 | Discharge: 2019-11-01 | Disposition: A | Discharge status: Home / Self Care | Payer: Managed Care, Other (non HMO) | Attending: Obstetrics and Gynecology | Admitting: Obstetrics and Gynecology

## 2019-11-01 ENCOUNTER — Other Ambulatory Visit: Payer: Self-pay

## 2019-11-01 ENCOUNTER — Encounter: Payer: Self-pay | Admitting: Obstetrics and Gynecology

## 2019-11-01 ENCOUNTER — Observation Stay: Payer: Managed Care, Other (non HMO)

## 2019-11-01 DIAGNOSIS — Z3A24 24 weeks gestation of pregnancy: Secondary | ICD-10-CM | POA: Diagnosis not present

## 2019-11-01 DIAGNOSIS — O26612 Liver and biliary tract disorders in pregnancy, second trimester: Secondary | ICD-10-CM | POA: Diagnosis not present

## 2019-11-01 DIAGNOSIS — R1011 Right upper quadrant pain: Secondary | ICD-10-CM | POA: Diagnosis not present

## 2019-11-01 DIAGNOSIS — K8020 Calculus of gallbladder without cholecystitis without obstruction: Secondary | ICD-10-CM | POA: Insufficient documentation

## 2019-11-01 DIAGNOSIS — Z3A23 23 weeks gestation of pregnancy: Secondary | ICD-10-CM

## 2019-11-01 DIAGNOSIS — R1010 Upper abdominal pain, unspecified: Secondary | ICD-10-CM | POA: Diagnosis present

## 2019-11-01 DIAGNOSIS — O26892 Other specified pregnancy related conditions, second trimester: Secondary | ICD-10-CM | POA: Diagnosis present

## 2019-11-01 LAB — COMPREHENSIVE METABOLIC PANEL
ALT: 40 U/L (ref 0–44)
AST: 82 U/L — ABNORMAL HIGH (ref 15–41)
Albumin: 3.7 g/dL (ref 3.5–5.0)
Alkaline Phosphatase: 78 U/L (ref 38–126)
Anion gap: 12 (ref 5–15)
BUN: 7 mg/dL (ref 6–20)
CO2: 23 mmol/L (ref 22–32)
Calcium: 9.6 mg/dL (ref 8.9–10.3)
Chloride: 102 mmol/L (ref 98–111)
Creatinine, Ser: 0.5 mg/dL (ref 0.44–1.00)
GFR calc Af Amer: >60 mL/min (ref >60)
GFR calc non Af Amer: >60 mL/min (ref >60)
Glucose, Bld: 92 mg/dL (ref 70–99)
Potassium: 3.7 mmol/L (ref 3.5–5.1)
Sodium: 137 mmol/L (ref 135–145)
Total Bilirubin: 0.6 mg/dL (ref 0.3–1.2)
Total Protein: 7.2 g/dL (ref 6.5–8.1)

## 2019-11-01 LAB — LIPASE, BLOOD: Lipase: 36 U/L (ref 11–51)

## 2019-11-01 NOTE — Discharge Instructions (Signed)

## 2019-11-01 NOTE — Final Progress Note (Addendum)
L&D OB Triage Note  Felicia Curtis is a 24 y.o. G1P0 female at [redacted]w[redacted]d, EDD Estimated Date of Delivery: 02/23/20 who presented to triage for complaints of colicky upper abdominal pain.  Reports that the pain typically lasts 20 minutes and then subsides but this pain lasted a little longer. Was instructed by the after-hours nurse line to present for evaluation.  Patient denied fevers or chills. Concern is for gallbladder disease as she has had these attacks at least 5-6 times so far during the pregnancy but has not been confirmed. She was evaluated by the nurses with no significant findings, as pain had resolved while in triage. Vital signs stable. An NST was performed and has been reviewed by MD.    NST INTERPRETATION: Indications: rule out uterine contractions and abdominal pain in pregnancy.   Mode: External Baseline Rate (A): 140 bpm Variability: Moderate Accelerations: 15 x 15 Decelerations: None     Contraction Frequency (min): none  Impression: reactive     Labs:  Results for orders placed or performed during the hospital encounter of 11/01/19  Comprehensive metabolic panel  Result Value Ref Range   Sodium 137 135 - 145 mmol/L   Potassium 3.7 3.5 - 5.1 mmol/L   Chloride 102 98 - 111 mmol/L   CO2 23 22 - 32 mmol/L   Glucose, Bld 92 70 - 99 mg/dL   BUN 7 6 - 20 mg/dL   Creatinine, Ser 2.59 0.44 - 1.00 mg/dL   Calcium 9.6 8.9 - 56.3 mg/dL   Total Protein 7.2 6.5 - 8.1 g/dL   Albumin 3.7 3.5 - 5.0 g/dL   AST 82 (H) 15 - 41 U/L   ALT 40 0 - 44 U/L   Alkaline Phosphatase 78 38 - 126 U/L   Total Bilirubin 0.6 0.3 - 1.2 mg/dL   GFR calc non Af Amer >60 >60 mL/min   GFR calc Af Amer >60 >60 mL/min   Anion gap 12 5 - 15  Lipase, blood  Result Value Ref Range   Lipase 36 11 - 51 U/L     Imaging:  US Abdomen Limited RUQ CLINICAL DATA:  Upper abdominal pain on and off for 6 weeks. Twenty-four weeks pregnant patient.  EXAM: ULTRASOUND ABDOMEN LIMITED RIGHT UPPER  QUADRANT  COMPARISON:  None.  FINDINGS: Gallbladder:  Mildly distended. Multiple small dependent stones. No wall thickening or pericholecystic fluid. No sonographic Murphy's sign.  Common bile duct:  Diameter: 6 mm. No visualized duct stone. Distal duct not completely imaged.  Liver:  No focal lesion identified. Within normal limits in parenchymal echogenicity. Portal vein is patent on color Doppler imaging with normal direction of blood flow towards the liver.  Other: None.  IMPRESSION: 1. Cholelithiasis without evidence of acute cholecystitis. 2. Dilated common bile duct to 6 mm. No visualized duct stone, but the most distal aspect of the duct was not visualized. If there is clinical concern for a distal duct stone, follow-up MRI/MRCP would be recommended.  Electronically Signed   By: Amie Portland M.D.   On: 11/01/2019 20:49  Plan: NST performed was reviewed and was found to be reactive. Patient informed of confirmation of gallstones. Continued to encourage modified dietary restrictions, can use Tylenol for pain. Patient notes she would like to avoid surgical intervention while pregnant if possible. She was discharged home.  Continue routine prenatal care. Follow up with OB/GYN as previously scheduled.     Hildred Laser, MD [facility]

## 2019-11-01 NOTE — OB Triage Note (Signed)
Pt presents with c/o RUQ x 6weeks with random painful attacks of pain that take her breath away 10/10. Sudden onset with relief after vomitting. Pt has not had a work up for this and has been told it is likely gall bladder. Pt feels like it maybe related to her diet but today nothing precipitated the event. Pt is A&O x 4 and is a good historian

## 2019-11-07 DIAGNOSIS — K8020 Calculus of gallbladder without cholecystitis without obstruction: Secondary | ICD-10-CM

## 2019-11-07 HISTORY — DX: Calculus of gallbladder without cholecystitis without obstruction: K80.20

## 2019-11-23 ENCOUNTER — Other Ambulatory Visit: Payer: Managed Care, Other (non HMO)

## 2019-11-23 ENCOUNTER — Encounter: Payer: Managed Care, Other (non HMO) | Admitting: Obstetrics and Gynecology

## 2019-11-27 ENCOUNTER — Other Ambulatory Visit: Payer: Self-pay

## 2019-11-27 ENCOUNTER — Other Ambulatory Visit: Payer: Managed Care, Other (non HMO)

## 2019-11-27 ENCOUNTER — Encounter: Payer: Self-pay | Admitting: Obstetrics and Gynecology

## 2019-11-27 ENCOUNTER — Ambulatory Visit (INDEPENDENT_AMBULATORY_CARE_PROVIDER_SITE_OTHER): Payer: Managed Care, Other (non HMO) | Admitting: Obstetrics and Gynecology

## 2019-11-27 VITALS — BP 111/79 | HR 98 | Wt 205.8 lb

## 2019-11-27 DIAGNOSIS — Z3402 Encounter for supervision of normal first pregnancy, second trimester: Secondary | ICD-10-CM

## 2019-11-27 DIAGNOSIS — Z3A27 27 weeks gestation of pregnancy: Secondary | ICD-10-CM | POA: Diagnosis not present

## 2019-11-27 DIAGNOSIS — Z23 Encounter for immunization: Secondary | ICD-10-CM | POA: Diagnosis not present

## 2019-11-27 LAB — POCT URINALYSIS DIPSTICK OB
Bilirubin, UA: NEGATIVE
Blood, UA: NEGATIVE
Glucose, UA: NEGATIVE
Ketones, UA: NEGATIVE
Leukocytes, UA: NEGATIVE
Nitrite, UA: NEGATIVE
POC,PROTEIN,UA: NEGATIVE
Spec Grav, UA: 1.01 (ref 1.010–1.025)
Urobilinogen, UA: 0.2 E.U./dL
pH, UA: 6.5 (ref 5.0–8.0)

## 2019-11-27 NOTE — Addendum Note (Signed)
Addended by: Dorian Pod on: 11/27/2019 02:09 PM   Modules accepted: Orders

## 2019-11-27 NOTE — Progress Notes (Signed)
ROB: Patient modifying her diet and has not had a gallbladder issue in over 1 week.  Reports daily fetal movement.  1 hour GCT today.

## 2019-11-28 ENCOUNTER — Telehealth: Payer: Self-pay

## 2019-11-28 LAB — CBC
Hematocrit: 36.3 % (ref 34.0–46.6)
Hemoglobin: 11.9 g/dL (ref 11.1–15.9)
MCH: 30.9 pg (ref 26.6–33.0)
MCHC: 32.8 g/dL (ref 31.5–35.7)
MCV: 94 fL (ref 79–97)
Platelets: 205 10*3/uL (ref 150–450)
RBC: 3.85 x10E6/uL (ref 3.77–5.28)
RDW: 12.7 % (ref 11.7–15.4)
WBC: 11.9 10*3/uL — ABNORMAL HIGH (ref 3.4–10.8)

## 2019-11-28 LAB — GLUCOSE, 1 HOUR GESTATIONAL: Gestational Diabetes Screen: 136 mg/dL (ref 65–139)

## 2019-11-28 LAB — RPR: RPR Ser Ql: NONREACTIVE

## 2019-11-28 NOTE — Telephone Encounter (Signed)
Pt is requesting 1 hour glucose results. She has seen them but does not understand them.   Pls advise.

## 2019-11-28 NOTE — Telephone Encounter (Signed)
Please advise 

## 2019-11-30 NOTE — Telephone Encounter (Signed)
LM for patient to return call.

## 2019-12-03 NOTE — Telephone Encounter (Signed)
Notified patient of lab results.  Patient verbalized understanding.  

## 2019-12-21 ENCOUNTER — Ambulatory Visit (INDEPENDENT_AMBULATORY_CARE_PROVIDER_SITE_OTHER): Payer: Managed Care, Other (non HMO) | Admitting: Obstetrics and Gynecology

## 2019-12-21 ENCOUNTER — Encounter: Payer: Self-pay | Admitting: Obstetrics and Gynecology

## 2019-12-21 ENCOUNTER — Other Ambulatory Visit: Payer: Self-pay

## 2019-12-21 VITALS — BP 126/84 | HR 89 | Wt 210.7 lb

## 2019-12-21 DIAGNOSIS — Z3A30 30 weeks gestation of pregnancy: Secondary | ICD-10-CM

## 2019-12-21 DIAGNOSIS — Z3402 Encounter for supervision of normal first pregnancy, second trimester: Secondary | ICD-10-CM

## 2019-12-21 LAB — POCT URINALYSIS DIPSTICK OB
Bilirubin, UA: NEGATIVE
Blood, UA: NEGATIVE
Glucose, UA: NEGATIVE
Ketones, UA: NEGATIVE
Leukocytes, UA: NEGATIVE
Nitrite, UA: NEGATIVE
POC,PROTEIN,UA: NEGATIVE
Spec Grav, UA: 1.01 (ref 1.010–1.025)
Urobilinogen, UA: 0.2 E.U./dL
pH, UA: 7 (ref 5.0–8.0)

## 2019-12-21 NOTE — Patient Instructions (Signed)
Third Trimester of Pregnancy  The third trimester is from week 28 through week 40 (months 7 through 9). This trimester is when your unborn baby (fetus) is growing very fast. At the end of the ninth month, the unborn baby is about 20 inches in length. It weighs about 6-10 pounds. Follow these instructions at home: Medicines  Take over-the-counter and prescription medicines only as told by your doctor. Some medicines are safe and some medicines are not safe during pregnancy.  Take a prenatal vitamin that contains at least 600 micrograms (mcg) of folic acid.  If you have trouble pooping (constipation), take medicine that will make your stool soft (stool softener) if your doctor approves. Eating and drinking   Eat regular, healthy meals.  Avoid raw meat and uncooked cheese.  If you get low calcium from the food you eat, talk to your doctor about taking a daily calcium supplement.  Eat four or five small meals rather than three large meals a day.  Avoid foods that are high in fat and sugars, such as fried and sweet foods.  To prevent constipation: ? Eat foods that are high in fiber, like fresh fruits and vegetables, whole grains, and beans. ? Drink enough fluids to keep your pee (urine) clear or pale yellow. Activity  Exercise only as told by your doctor. Stop exercising if you start to have cramps.  Avoid heavy lifting, wear low heels, and sit up straight.  Do not exercise if it is too hot, too humid, or if you are in a place of great height (high altitude).  You may continue to have sex unless your doctor tells you not to. Relieving pain and discomfort  Wear a good support bra if your breasts are tender.  Take frequent breaks and rest with your legs raised if you have leg cramps or low back pain.  Take warm water baths (sitz baths) to soothe pain or discomfort caused by hemorrhoids. Use hemorrhoid cream if your doctor approves.  If you develop puffy, bulging veins  (varicose veins) in your legs: ? Wear support hose or compression stockings as told by your doctor. ? Raise (elevate) your feet for 15 minutes, 3-4 times a day. ? Limit salt in your food. Safety  Wear your seat belt when driving.  Make a list of emergency phone numbers, including numbers for family, friends, the hospital, and police and fire departments. Preparing for your baby's arrival To prepare for the arrival of your baby:  Take prenatal classes.  Practice driving to the hospital.  Visit the hospital and tour the maternity area.  Talk to your work about taking leave once the baby comes.  Pack your hospital bag.  Prepare the baby's room.  Go to your doctor visits.  Buy a rear-facing car seat. Learn how to install it in your car. General instructions  Do not use hot tubs, steam rooms, or saunas.  Do not use any products that contain nicotine or tobacco, such as cigarettes and e-cigarettes. If you need help quitting, ask your doctor.  Do not drink alcohol.  Do not douche or use tampons or scented sanitary pads.  Do not cross your legs for long periods of time.  Do not travel for long distances unless you must. Only do so if your doctor says it is okay.  Visit your dentist if you have not gone during your pregnancy. Use a soft toothbrush to brush your teeth. Be gentle when you floss.  Avoid cat litter boxes and  soil used by cats. These carry germs that can cause birth defects in the baby and can cause a loss of your baby (miscarriage) or stillbirth.  Keep all your prenatal visits as told by your doctor. This is important. Contact a doctor if:  You are not sure if you are in labor or if your water has broken.  You are dizzy.  You have mild cramps or pressure in your lower belly.  You have a nagging pain in your belly area.  You continue to feel sick to your stomach, you throw up, or you have watery poop.  You have bad smelling fluid coming from your  vagina.  You have pain when you pee. Get help right away if:  You have a fever.  You are leaking fluid from your vagina.  You are spotting or bleeding from your vagina.  You have severe belly cramps or pain.  You lose or gain weight quickly.  You have trouble catching your breath and have chest pain.  You notice sudden or extreme puffiness (swelling) of your face, hands, ankles, feet, or legs.  You have not felt the baby move in over an hour.  You have severe headaches that do not go away with medicine.  You have trouble seeing.  You are leaking, or you are having a gush of fluid, from your vagina before you are 37 weeks.  You have regular belly spasms (contractions) before you are 37 weeks. Summary  The third trimester is from week 28 through week 40 (months 7 through 9). This time is when your unborn baby is growing very fast.  Follow your doctor's advice about medicine, food, and activity.  Get ready for the arrival of your baby by taking prenatal classes, getting all the baby items ready, preparing the baby's room, and visiting your doctor to be checked.  Get help right away if you are bleeding from your vagina, or you have chest pain and trouble catching your breath, or if you have not felt your baby move in over an hour. This information is not intended to replace advice given to you by your health care provider. Make sure you discuss any questions you have with your health care provider. Document Revised: 06/08/2018 Document Reviewed: 03/23/2016 Elsevier Patient Education  2020 [employer]  [employer] of the uterus can occur throughout pregnancy, but they are not always a sign that you are in labor. You may have practice contractions called Braxton Hicks contractions. These false labor contractions are sometimes confused with true labor. What are Felicia Curtis contractions? Braxton Hicks contractions are tightening movements that  occur in the muscles of the uterus before labor. Unlike true labor contractions, these contractions do not result in opening (dilation) and thinning of the cervix. Toward the end of pregnancy (32-34 weeks), Braxton Hicks contractions can happen more often and may become stronger. These contractions are sometimes difficult to tell apart from true labor because they can be very uncomfortable. You should not feel embarrassed if you go to the hospital with false labor. Sometimes, the only way to tell if you are in true labor is for your health care provider to look for changes in the cervix. The health care provider will do a physical exam and may monitor your contractions. If you are not in true labor, the exam should show that your cervix is not dilating and your water has not broken. If there are no other health problems associated with your pregnancy, it is  completely safe for you to be sent home with false labor. You may continue to have Braxton Hicks contractions until you go into true labor. How to tell the difference between true labor and false labor True labor  Contractions last 30-70 seconds.  Contractions become very regular.  Discomfort is usually felt in the top of the uterus, and it spreads to the lower abdomen and low back.  Contractions do not go away with walking.  Contractions usually become more intense and increase in frequency.  The cervix dilates and gets thinner. False labor  Contractions are usually shorter and not as strong as true labor contractions.  Contractions are usually irregular.  Contractions are often felt in the front of the lower abdomen and in the groin.  Contractions may go away when you walk around or change positions while lying down.  Contractions get weaker and are shorter-lasting as time goes on.  The cervix usually does not dilate or become thin. Follow these instructions at home:   Take over-the-counter and prescription medicines only as told by your health care  provider.  Keep up with your usual exercises and follow other instructions from your health care provider.  Eat and drink lightly if you think you are going into labor.  If Braxton Hicks contractions are making you uncomfortable: ? Change your position from lying down or resting to walking, or change from walking to resting. ? Sit and rest in a tub of warm water. ? Drink enough fluid to keep your urine pale yellow. Dehydration may cause these contractions. ? Do slow and deep breathing several times an hour.  Keep all follow-up prenatal visits as told by your health care provider. This is important. Contact a health care provider if:  You have a fever.  You have continuous pain in your abdomen. Get help right away if:  Your contractions become stronger, more regular, and closer together.  You have fluid leaking or gushing from your vagina.  You pass blood-tinged mucus (bloody show).  You have bleeding from your vagina.  You have low back pain that you never had before.  You feel your baby's head pushing down and causing pelvic pressure.  Your baby is not moving inside you as much as it used to. Summary  Contractions that occur before labor are called Braxton Hicks contractions, false labor, or practice contractions.  Braxton Hicks contractions are usually shorter, weaker, farther apart, and less regular than true labor contractions. True labor contractions usually become progressively stronger and regular, and they become more frequent.  Manage discomfort from Braxton Hicks contractions by changing position, resting in a warm bath, drinking plenty of water, or practicing deep breathing. This information is not intended to replace advice given to you by your health care provider. Make sure you discuss any questions you have with your health care provider. Document Revised: 01/28/2017 Document Reviewed: 07/01/2016 Elsevier Patient Education  2020 [employer]  

## 2019-12-21 NOTE — Progress Notes (Signed)
ROB: Patient doing well-no gallbladder pain. Reports daily fetal movement.

## 2019-12-28 DIAGNOSIS — Z0289 Encounter for other administrative examinations: Secondary | ICD-10-CM

## 2020-01-03 ENCOUNTER — Other Ambulatory Visit: Payer: Self-pay

## 2020-01-03 ENCOUNTER — Encounter: Payer: Self-pay | Admitting: Obstetrics and Gynecology

## 2020-01-03 ENCOUNTER — Ambulatory Visit (INDEPENDENT_AMBULATORY_CARE_PROVIDER_SITE_OTHER): Payer: Managed Care, Other (non HMO) | Admitting: Obstetrics and Gynecology

## 2020-01-03 VITALS — BP 106/72 | HR 94 | Wt 212.1 lb

## 2020-01-03 DIAGNOSIS — Z3403 Encounter for supervision of normal first pregnancy, third trimester: Secondary | ICD-10-CM

## 2020-01-03 LAB — POCT URINALYSIS DIPSTICK OB
Bilirubin, UA: NEGATIVE
Blood, UA: NEGATIVE
Glucose, UA: NEGATIVE
Ketones, UA: NEGATIVE
Leukocytes, UA: NEGATIVE
Nitrite, UA: NEGATIVE
POC,PROTEIN,UA: NEGATIVE
Spec Grav, UA: 1.025 (ref 1.010–1.025)
Urobilinogen, UA: 0.2 E.U./dL
pH, UA: 6 (ref 5.0–8.0)

## 2020-01-03 NOTE — Progress Notes (Signed)
ROB: Patient complains of lower abdominal and vaginal pain/pressure. Is tolerable. Understands normal aches and pains of pregnancy. Also noting some hemorrhoids, increasing fiber and water intake, using stool softeners. Discussed breastfeeding.    The following were addressed during this visit:  Breastfeeding Education - Early initiation of breastfeeding    Comments: Keeps milk supply adequate, helps contract uterus and slow bleeding, and early milk is the perfect first food and is easy to digest.   - The importance of exclusive breastfeeding    Comments: Provides antibodies, Lower risk of breast and ovarian cancers, and type-2 diabetes,Helps your body recover, Reduced chance of SIDS.   - Exclusive breastfeeding for the first 6 months    Comments: Builds a healthy milk supply and keeps it up, protects baby from sickness and disease, and breastmilk has everything your baby needs for the first 6 months.

## 2020-01-03 NOTE — Patient Instructions (Signed)
 Third Trimester of Pregnancy The third trimester is from week 28 through week 40 (months 7 through 9). The third trimester is a time when the unborn baby (fetus) is growing rapidly. At the end of the ninth month, the fetus is about 20 inches in length and weighs 6-10 pounds. Body changes during your third trimester Your body will continue to go through many changes during pregnancy. The changes vary from woman to woman. During the third trimester:  Your weight will continue to increase. You can expect to gain 25-35 pounds (11-16 kg) by the end of the pregnancy.  You may begin to get stretch marks on your hips, abdomen, and breasts.  You may urinate more often because the fetus is moving lower into your pelvis and pressing on your bladder.  You may develop or continue to have heartburn. This is caused by increased hormones that slow down muscles in the digestive tract.  You may develop or continue to have constipation because increased hormones slow digestion and cause the muscles that push waste through your intestines to relax.  You may develop hemorrhoids. These are swollen veins (varicose veins) in the rectum that can itch or be painful.  You may develop swollen, bulging veins (varicose veins) in your legs.  You may have increased body aches in the pelvis, back, or thighs. This is due to weight gain and increased hormones that are relaxing your joints.  You may have changes in your hair. These can include thickening of your hair, rapid growth, and changes in texture. Some women also have hair loss during or after pregnancy, or hair that feels dry or thin. Your hair will most likely return to normal after your baby is born.  Your breasts will continue to grow and they will continue to become tender. A yellow fluid (colostrum) may leak from your breasts. This is the first milk you are producing for your baby.  Your belly button may stick out.  You may notice more swelling in your  hands, face, or ankles.  You may have increased tingling or numbness in your hands, arms, and legs. The skin on your belly may also feel numb.  You may feel short of breath because of your expanding uterus.  You may have more problems sleeping. This can be caused by the size of your belly, increased need to urinate, and an increase in your body's metabolism.  You may notice the fetus "dropping," or moving lower in your abdomen (lightening).  You may have increased vaginal discharge.  You may notice your joints feel loose and you may have pain around your pelvic bone. What to expect at prenatal visits You will have prenatal exams every 2 weeks until week 36. Then you will have weekly prenatal exams. During a routine prenatal visit:  You will be weighed to make sure you and the baby are growing normally.  Your blood pressure will be taken.  Your abdomen will be measured to track your baby's growth.  The fetal heartbeat will be listened to.  Any test results from the previous visit will be discussed.  You may have a cervical check near your due date to see if your cervix has softened or thinned (effaced).  You will be tested for Group B streptococcus. This happens between 35 and 37 weeks. Your health care provider may ask you:  What your birth plan is.  How you are feeling.  If you are feeling the baby move.  If you have had any   abnormal symptoms, such as leaking fluid, bleeding, severe headaches, or abdominal cramping.  If you are using any tobacco products, including cigarettes, chewing tobacco, and electronic cigarettes.  If you have any questions. Other tests or screenings that may be performed during your third trimester include:  Blood tests that check for low iron levels (anemia).  Fetal testing to check the health, activity level, and growth of the fetus. Testing is done if you have certain medical conditions or if there are problems during the  pregnancy.  Nonstress test (NST). This test checks the health of your baby to make sure there are no signs of problems, such as the baby not getting enough oxygen. During this test, a belt is placed around your belly. The baby is made to move, and its heart rate is monitored during movement. What is false labor? False labor is a condition in which you feel small, irregular tightenings of the muscles in the womb (contractions) that usually go away with rest, changing position, or drinking water. These are called Braxton Hicks contractions. Contractions may last for hours, days, or even weeks before true labor sets in. If contractions come at regular intervals, become more frequent, increase in intensity, or become painful, you should see your health care provider. What are the signs of labor?  Abdominal cramps.  Regular contractions that start at 10 minutes apart and become stronger and more frequent with time.  Contractions that start on the top of the uterus and spread down to the lower abdomen and back.  Increased pelvic pressure and dull back pain.  A watery or bloody mucus discharge that comes from the vagina.  Leaking of amniotic fluid. This is also known as your "water breaking." It could be a slow trickle or a gush. Let your health care provider know if it has a color or strange odor. If you have any of these signs, call your health care provider right away, even if it is before your due date. Follow these instructions at home: Medicines  Follow your health care provider's instructions regarding medicine use. Specific medicines may be either safe or unsafe to take during pregnancy.  Take a prenatal vitamin that contains at least 600 micrograms (mcg) of folic acid.  If you develop constipation, try taking a stool softener if your health care provider approves. Eating and drinking   Eat a balanced diet that includes fresh fruits and vegetables, whole grains, good sources of protein  such as meat, eggs, or tofu, and low-fat dairy. Your health care provider will help you determine the amount of weight gain that is right for you.  Avoid raw meat and uncooked cheese. These carry germs that can cause birth defects in the baby.  If you have low calcium intake from food, talk to your health care provider about whether you should take a daily calcium supplement.  Eat four or five small meals rather than three large meals a day.  Limit foods that are high in fat and processed sugars, such as fried and sweet foods.  To prevent constipation: ? Drink enough fluid to keep your urine clear or pale yellow. ? Eat foods that are high in fiber, such as fresh fruits and vegetables, whole grains, and beans. Activity  Exercise only as directed by your health care provider. Most women can continue their usual exercise routine during pregnancy. Try to exercise for 30 minutes at least 5 days a week. Stop exercising if you experience uterine contractions.  Avoid heavy lifting.    Do not exercise in extreme heat or humidity, or at high altitudes.  Wear low-heel, comfortable shoes.  Practice good posture.  You may continue to have sex unless your health care provider tells you otherwise. Relieving pain and discomfort  Take frequent breaks and rest with your legs elevated if you have leg cramps or low back pain.  Take warm sitz baths to soothe any pain or discomfort caused by hemorrhoids. Use hemorrhoid cream if your health care provider approves.  Wear a good support bra to prevent discomfort from breast tenderness.  If you develop varicose veins: ? Wear support pantyhose or compression stockings as told by your healthcare provider. ? Elevate your feet for 15 minutes, 3-4 times a day. Prenatal care  Write down your questions. Take them to your prenatal visits.  Keep all your prenatal visits as told by your health care provider. This is important. Safety  Wear your seat belt at  all times when driving.  Make a list of emergency phone numbers, including numbers for family, friends, the hospital, and police and fire departments. General instructions  Avoid cat litter boxes and soil used by cats. These carry germs that can cause birth defects in the baby. If you have a cat, ask someone to clean the litter box for you.  Do not travel far distances unless it is absolutely necessary and only with the approval of your health care provider.  Do not use hot tubs, steam rooms, or saunas.  Do not drink alcohol.  Do not use any products that contain nicotine or tobacco, such as cigarettes and e-cigarettes. If you need help quitting, ask your health care provider.  Do not use any medicinal herbs or unprescribed drugs. These chemicals affect the formation and growth of the baby.  Do not douche or use tampons or scented sanitary pads.  Do not cross your legs for long periods of time.  To prepare for the arrival of your baby: ? Take prenatal classes to understand, practice, and ask questions about labor and delivery. ? Make a trial run to the hospital. ? Visit the hospital and tour the maternity area. ? Arrange for maternity or paternity leave through employers. ? Arrange for family and friends to take care of pets while you are in the hospital. ? Purchase a rear-facing car seat and make sure you know how to install it in your car. ? Pack your hospital bag. ? Prepare the baby's nursery. Make sure to remove all pillows and stuffed animals from the baby's crib to prevent suffocation.  Visit your dentist if you have not gone during your pregnancy. Use a soft toothbrush to brush your teeth and be gentle when you floss. Contact a health care provider if:  You are unsure if you are in labor or if your water has broken.  You become dizzy.  You have mild pelvic cramps, pelvic pressure, or nagging pain in your abdominal area.  You have lower back pain.  You have persistent  nausea, vomiting, or diarrhea.  You have an unusual or bad smelling vaginal discharge.  You have pain when you urinate. Get help right away if:  Your water breaks before 37 weeks.  You have regular contractions less than 5 minutes apart before 37 weeks.  You have a fever.  You are leaking fluid from your vagina.  You have spotting or bleeding from your vagina.  You have severe abdominal pain or cramping.  You have rapid weight loss or weight gain.  You   have shortness of breath with chest pain.  You notice sudden or extreme swelling of your face, hands, ankles, feet, or legs.  Your baby makes fewer than 10 movements in 2 hours.  You have severe headaches that do not go away when you take medicine.  You have vision changes. Summary  The third trimester is from week 28 through week 40, months 7 through 9. The third trimester is a time when the unborn baby (fetus) is growing rapidly.  During the third trimester, your discomfort may increase as you and your baby continue to gain weight. You may have abdominal, leg, and back pain, sleeping problems, and an increased need to urinate.  During the third trimester your breasts will keep growing and they will continue to become tender. A yellow fluid (colostrum) may leak from your breasts. This is the first milk you are producing for your baby.  False labor is a condition in which you feel small, irregular tightenings of the muscles in the womb (contractions) that eventually go away. These are called Braxton Hicks contractions. Contractions may last for hours, days, or even weeks before true labor sets in.  Signs of labor can include: abdominal cramps; regular contractions that start at 10 minutes apart and become stronger and more frequent with time; watery or bloody mucus discharge that comes from the vagina; increased pelvic pressure and dull back pain; and leaking of amniotic fluid. This information is not intended to replace advice  given to you by your health care provider. Make sure you discuss any questions you have with your health care provider. Document Revised: 06/08/2018 Document Reviewed: 03/23/2016 [employer] Patient Education  2020 [employer]   Breastfeeding  Choosing to breastfeed is one of the best decisions you can make for yourself and your baby. A change in hormones during pregnancy causes your breasts to make breast milk in your milk-producing glands. Hormones prevent breast milk from being released before your baby is born. They also prompt milk flow after birth. Once breastfeeding has begun, thoughts of your baby, as well as his or her sucking or crying, can stimulate the release of milk from your milk-producing glands. Benefits of breastfeeding Research shows that breastfeeding offers many health benefits for infants and mothers. It also offers a cost-free and convenient way to feed your baby. For your baby  Your first milk (colostrum) helps your baby's digestive system to function better.  Special cells in your milk (antibodies) help your baby to fight off infections.  Breastfed babies are less likely to develop asthma, allergies, obesity, or type 2 diabetes. They are also at lower risk for sudden infant death syndrome (SIDS).  Nutrients in breast milk are better able to meet your baby's needs compared to infant formula.  Breast milk improves your baby's brain development. For you  Breastfeeding helps to create a very special bond between you and your baby.  Breastfeeding is convenient. Breast milk costs nothing and is always available at the correct temperature.  Breastfeeding helps to burn calories. It helps you to lose the weight that you gained during pregnancy.  Breastfeeding makes your uterus return faster to its size before pregnancy. It also slows bleeding (lochia) after you give birth.  Breastfeeding helps to lower your risk of developing type 2 diabetes, osteoporosis, rheumatoid  arthritis, cardiovascular disease, and breast, ovarian, uterine, and endometrial cancer later in life. Breastfeeding basics Starting breastfeeding  Find a comfortable place to sit or lie down, with your neck and back well-supported.  Place   a pillow or a rolled-up blanket under your baby to bring him or her to the level of your breast (if you are seated). Nursing pillows are specially designed to help support your arms and your baby while you breastfeed.  Make sure that your baby's tummy (abdomen) is facing your abdomen.  Gently massage your breast. With your fingertips, massage from the outer edges of your breast inward toward the nipple. This encourages milk flow. If your milk flows slowly, you may need to continue this action during the feeding.  Support your breast with 4 fingers underneath and your thumb above your nipple (make the letter "C" with your hand). Make sure your fingers are well away from your nipple and your baby's mouth.  Stroke your baby's lips gently with your finger or nipple.  When your baby's mouth is open wide enough, quickly bring your baby to your breast, placing your entire nipple and as much of the areola as possible into your baby's mouth. The areola is the colored area around your nipple. ? More areola should be visible above your baby's upper lip than below the lower lip. ? Your baby's lips should be opened and extended outward (flanged) to ensure an adequate, comfortable latch. ? Your baby's tongue should be between his or her lower gum and your breast.  Make sure that your baby's mouth is correctly positioned around your nipple (latched). Your baby's lips should create a seal on your breast and be turned out (everted).  It is common for your baby to suck about 2-3 minutes in order to start the flow of breast milk. Latching Teaching your baby how to latch onto your breast properly is very important. An improper latch can cause nipple pain, decreased milk  supply, and poor weight gain in your baby. Also, if your baby is not latched onto your nipple properly, he or she may swallow some air during feeding. This can make your baby fussy. Burping your baby when you switch breasts during the feeding can help to get rid of the air. However, teaching your baby to latch on properly is still the best way to prevent fussiness from swallowing air while breastfeeding. Signs that your baby has successfully latched onto your nipple  Silent tugging or silent sucking, without causing you pain. Infant's lips should be extended outward (flanged).  Swallowing heard between every 3-4 sucks once your milk has started to flow (after your let-down milk reflex occurs).  Muscle movement above and in front of his or her ears while sucking. Signs that your baby has not successfully latched onto your nipple  Sucking sounds or smacking sounds from your baby while breastfeeding.  Nipple pain. If you think your baby has not latched on correctly, slip your finger into the corner of your baby's mouth to break the suction and place it between your baby's gums. Attempt to start breastfeeding again. Signs of successful breastfeeding Signs from your baby  Your baby will gradually decrease the number of sucks or will completely stop sucking.  Your baby will fall asleep.  Your baby's body will relax.  Your baby will retain a small amount of milk in his or her mouth.  Your baby will let go of your breast by himself or herself. Signs from you  Breasts that have increased in firmness, weight, and size 1-3 hours after feeding.  Breasts that are softer immediately after breastfeeding.  Increased milk volume, as well as a change in milk consistency and color by the fifth   day of breastfeeding.  Nipples that are not sore, cracked, or bleeding. Signs that your baby is getting enough milk  Wetting at least 1-2 diapers during the first 24 hours after birth.  Wetting at least 5-6  diapers every 24 hours for the first week after birth. The urine should be clear or pale yellow by the age of 5 days.  Wetting 6-8 diapers every 24 hours as your baby continues to grow and develop.  At least 3 stools in a 24-hour period by the age of 5 days. The stool should be soft and yellow.  At least 3 stools in a 24-hour period by the age of 7 days. The stool should be seedy and yellow.  No loss of weight greater than 10% of birth weight during the first 3 days of life.  Average weight gain of 4-7 oz (113-198 g) per week after the age of 4 days.  Consistent daily weight gain by the age of 5 days, without weight loss after the age of 2 weeks. After a feeding, your baby may spit up a small amount of milk. This is normal. Breastfeeding frequency and duration Frequent feeding will help you make more milk and can prevent sore nipples and extremely full breasts (breast engorgement). Breastfeed when you feel the need to reduce the fullness of your breasts or when your baby shows signs of hunger. This is called "breastfeeding on demand." Signs that your baby is hungry include:  Increased alertness, activity, or restlessness.  Movement of the head from side to side.  Opening of the mouth when the corner of the mouth or cheek is stroked (rooting).  Increased sucking sounds, smacking lips, cooing, sighing, or squeaking.  Hand-to-mouth movements and sucking on fingers or hands.  Fussing or crying. Avoid introducing a pacifier to your baby in the first 4-6 weeks after your baby is born. After this time, you may choose to use a pacifier. Research has shown that pacifier use during the first year of a baby's life decreases the risk of sudden infant death syndrome (SIDS). Allow your baby to feed on each breast as long as he or she wants. When your baby unlatches or falls asleep while feeding from the first breast, offer the second breast. Because newborns are often sleepy in the first few weeks of  life, you may need to awaken your baby to get him or her to feed. Breastfeeding times will vary from baby to baby. However, the following rules can serve as a guide to help you make sure that your baby is properly fed:  Newborns (babies 4 weeks of age or younger) may breastfeed every 1-3 hours.  Newborns should not go without breastfeeding for longer than 3 hours during the day or 5 hours during the night.  You should breastfeed your baby a minimum of 8 times in a 24-hour period. Breast milk pumping     Pumping and storing breast milk allows you to make sure that your baby is exclusively fed your breast milk, even at times when you are unable to breastfeed. This is especially important if you go back to work while you are still breastfeeding, or if you are not able to be present during feedings. Your lactation consultant can help you find a method of pumping that works best for you and give you guidelines about how long it is safe to store breast milk. Caring for your breasts while you breastfeed Nipples can become dry, cracked, and sore while breastfeeding. The following   recommendations can help keep your breasts moisturized and healthy:  Avoid using soap on your nipples.  Wear a supportive bra designed especially for nursing. Avoid wearing underwire-style bras or extremely tight bras (sports bras).  Air-dry your nipples for 3-4 minutes after each feeding.  Use only cotton bra pads to absorb leaked breast milk. Leaking of breast milk between feedings is normal.  Use lanolin on your nipples after breastfeeding. Lanolin helps to maintain your skin's normal moisture barrier. Pure lanolin is not harmful (not toxic) to your baby. You may also hand express a few drops of breast milk and gently massage that milk into your nipples and allow the milk to air-dry. In the first few weeks after giving birth, some women experience breast engorgement. Engorgement can make your breasts feel heavy, warm,  and tender to the touch. Engorgement peaks within 3-5 days after you give birth. The following recommendations can help to ease engorgement:  Completely empty your breasts while breastfeeding or pumping. You may want to start by applying warm, moist heat (in the shower or with warm, water-soaked hand towels) just before feeding or pumping. This increases circulation and helps the milk flow. If your baby does not completely empty your breasts while breastfeeding, pump any extra milk after he or she is finished.  Apply ice packs to your breasts immediately after breastfeeding or pumping, unless this is too uncomfortable for you. To do this: ? Put ice in a plastic bag. ? Place a towel between your skin and the bag. ? Leave the ice on for 20 minutes, 2-3 times a day.  Make sure that your baby is latched on and positioned properly while breastfeeding. If engorgement persists after 48 hours of following these recommendations, contact your health care provider or a lactation consultant. Overall health care recommendations while breastfeeding  Eat 3 healthy meals and 3 snacks every day. Well-nourished mothers who are breastfeeding need an additional 450-500 calories a day. You can meet this requirement by increasing the amount of a balanced diet that you eat.  Drink enough water to keep your urine pale yellow or clear.  Rest often, relax, and continue to take your prenatal vitamins to prevent fatigue, stress, and low vitamin and mineral levels in your body (nutrient deficiencies).  Do not use any products that contain nicotine or tobacco, such as cigarettes and e-cigarettes. Your baby may be harmed by chemicals from cigarettes that pass into breast milk and exposure to secondhand smoke. If you need help quitting, ask your health care provider.  Avoid alcohol.  Do not use illegal drugs or marijuana.  Talk with your health care provider before taking any medicines. These include over-the-counter and  prescription medicines as well as vitamins and herbal supplements. Some medicines that may be harmful to your baby can pass through breast milk.  It is possible to become pregnant while breastfeeding. If birth control is desired, ask your health care provider about options that will be safe while breastfeeding your baby. Where to find more information: [employer]: www.llli.org Contact a health care provider if:  You feel like you want to stop breastfeeding or have become frustrated with breastfeeding.  Your nipples are cracked or bleeding.  Your breasts are red, tender, or warm.  You have: ? Painful breasts or nipples. ? A swollen area on either breast. ? A fever or chills. ? Nausea or vomiting. ? Drainage other than breast milk from your nipples.  Your breasts do not become full before feedings   by the fifth day after you give birth.  You feel sad and depressed.  Your baby is: ? Too sleepy to eat well. ? Having trouble sleeping. ? More than 1 week old and wetting fewer than 6 diapers in a 24-hour period. ? Not gaining weight by 5 days of age.  Your baby has fewer than 3 stools in a 24-hour period.  Your baby's skin or the white parts of his or her eyes become yellow. Get help right away if:  Your baby is overly tired (lethargic) and does not want to wake up and feed.  Your baby develops an unexplained fever. Summary  Breastfeeding offers many health benefits for infant and mothers.  Try to breastfeed your infant when he or she shows early signs of hunger.  Gently tickle or stroke your baby's lips with your finger or nipple to allow the baby to open his or her mouth. Bring the baby to your breast. Make sure that much of the areola is in your baby's mouth. Offer one side and burp the baby before you offer the other side.  Talk with your health care provider or lactation consultant if you have questions or you face problems as you breastfeed. This  information is not intended to replace advice given to you by your health care provider. Make sure you discuss any questions you have with your health care provider. Document Revised: 05/12/2017 Document Reviewed: 03/19/2016 Elsevier Patient Education  2020 [employer]  

## 2020-01-03 NOTE — Progress Notes (Signed)
ROB-Pt present for routine prenatal care. Lower abd/hemorriod/vaginal pain and pressure and cramping report by pt.

## 2020-01-18 ENCOUNTER — Ambulatory Visit (INDEPENDENT_AMBULATORY_CARE_PROVIDER_SITE_OTHER): Payer: Managed Care, Other (non HMO) | Admitting: Obstetrics and Gynecology

## 2020-01-18 ENCOUNTER — Other Ambulatory Visit: Payer: Self-pay

## 2020-01-18 ENCOUNTER — Encounter: Payer: Self-pay | Admitting: Obstetrics and Gynecology

## 2020-01-18 VITALS — BP 105/69 | HR 97 | Wt 214.6 lb

## 2020-01-18 DIAGNOSIS — Z3A34 34 weeks gestation of pregnancy: Secondary | ICD-10-CM

## 2020-01-18 DIAGNOSIS — O479 False labor, unspecified: Secondary | ICD-10-CM

## 2020-01-18 DIAGNOSIS — Z3403 Encounter for supervision of normal first pregnancy, third trimester: Secondary | ICD-10-CM

## 2020-01-18 LAB — POCT URINALYSIS DIPSTICK OB
Bilirubin, UA: NEGATIVE
Blood, UA: NEGATIVE
Glucose, UA: NEGATIVE
Ketones, UA: NEGATIVE
Leukocytes, UA: NEGATIVE
Nitrite, UA: NEGATIVE
POC,PROTEIN,UA: NEGATIVE
Spec Grav, UA: 1.015 (ref 1.010–1.025)
Urobilinogen, UA: 0.2 E.U./dL
pH, UA: 7.5 (ref 5.0–8.0)

## 2020-01-18 NOTE — Patient Instructions (Addendum)
Third Trimester of Pregnancy The third trimester is from week 28 through week 40 (months 7 through 9). The third trimester is a time when the unborn baby (fetus) is growing rapidly. At the end of the ninth month, the fetus is about 20 inches in length and weighs 6-10 pounds. Body changes during your third trimester Your body will continue to go through many changes during pregnancy. The changes vary from woman to woman. During the third trimester:  Your weight will continue to increase. You can expect to gain 25-35 pounds (11-16 kg) by the end of the pregnancy.  You may begin to get stretch marks on your hips, abdomen, and breasts.  You may urinate more often because the fetus is moving lower into your pelvis and pressing on your bladder.  You may develop or continue to have heartburn. This is caused by increased hormones that slow down muscles in the digestive tract.  You may develop or continue to have constipation because increased hormones slow digestion and cause the muscles that push waste through your intestines to relax.  You may develop hemorrhoids. These are swollen veins (varicose veins) in the rectum that can itch or be painful.  You may develop swollen, bulging veins (varicose veins) in your legs.  You may have increased body aches in the pelvis, back, or thighs. This is due to weight gain and increased hormones that are relaxing your joints.  You may have changes in your hair. These can include thickening of your hair, rapid growth, and changes in texture. Some women also have hair loss during or after pregnancy, or hair that feels dry or thin. Your hair will most likely return to normal after your baby is born.  Your breasts will continue to grow and they will continue to become tender. A yellow fluid (colostrum) may leak from your breasts. This is the first milk you are producing for your baby.  Your belly button may stick out.  You may notice more swelling in your hands,  face, or ankles.  You may have increased tingling or numbness in your hands, arms, and legs. The skin on your belly may also feel numb.  You may feel short of breath because of your expanding uterus.  You may have more problems sleeping. This can be caused by the size of your belly, increased need to urinate, and an increase in your body's metabolism.  You may notice the fetus "dropping," or moving lower in your abdomen (lightening).  You may have increased vaginal discharge.  You may notice your joints feel loose and you may have pain around your pelvic bone. What to expect at prenatal visits You will have prenatal exams every 2 weeks until week 36. Then you will have weekly prenatal exams. During a routine prenatal visit:  You will be weighed to make sure you and the baby are growing normally.  Your blood pressure will be taken.  Your abdomen will be measured to track your baby's growth.  The fetal heartbeat will be listened to.  Any test results from the previous visit will be discussed.  You may have a cervical check near your due date to see if your cervix has softened or thinned (effaced).  You will be tested for Group B streptococcus. This happens between 35 and 37 weeks. Your health care provider may ask you:  What your birth plan is.  How you are feeling.  If you are feeling the baby move.  If you have had any abnormal   symptoms, such as leaking fluid, bleeding, severe headaches, or abdominal cramping.  If you are using any tobacco products, including cigarettes, chewing tobacco, and electronic cigarettes.  If you have any questions. Other tests or screenings that may be performed during your third trimester include:  Blood tests that check for low iron levels (anemia).  Fetal testing to check the health, activity level, and growth of the fetus. Testing is done if you have certain medical conditions or if there are problems during the pregnancy.  Nonstress test  (NST). This test checks the health of your baby to make sure there are no signs of problems, such as the baby not getting enough oxygen. During this test, a belt is placed around your belly. The baby is made to move, and its heart rate is monitored during movement. What is false labor? False labor is a condition in which you feel small, irregular tightenings of the muscles in the womb (contractions) that usually go away with rest, changing position, or drinking water. These are called Braxton Hicks contractions. Contractions may last for hours, days, or even weeks before true labor sets in. If contractions come at regular intervals, become more frequent, increase in intensity, or become painful, you should see your health care provider. What are the signs of labor?  Abdominal cramps.  Regular contractions that start at 10 minutes apart and become stronger and more frequent with time.  Contractions that start on the top of the uterus and spread down to the lower abdomen and back.  Increased pelvic pressure and dull back pain.  A watery or bloody mucus discharge that comes from the vagina.  Leaking of amniotic fluid. This is also known as your "water breaking." It could be a slow trickle or a gush. Let your health care provider know if it has a color or strange odor. If you have any of these signs, call your health care provider right away, even if it is before your due date. Follow these instructions at home: Medicines  Follow your health care provider's instructions regarding medicine use. Specific medicines may be either safe or unsafe to take during pregnancy.  Take a prenatal vitamin that contains at least 600 micrograms (mcg) of folic acid.  If you develop constipation, try taking a stool softener if your health care provider approves. Eating and drinking   Eat a balanced diet that includes fresh fruits and vegetables, whole grains, good sources of protein such as meat, eggs, or tofu,  and low-fat dairy. Your health care provider will help you determine the amount of weight gain that is right for you.  Avoid raw meat and uncooked cheese. These carry germs that can cause birth defects in the baby.  If you have low calcium intake from food, talk to your health care provider about whether you should take a daily calcium supplement.  Eat four or five small meals rather than three large meals a day.  Limit foods that are high in fat and processed sugars, such as fried and sweet foods.  To prevent constipation: ? Drink enough fluid to keep your urine clear or pale yellow. ? Eat foods that are high in fiber, such as fresh fruits and vegetables, whole grains, and beans. Activity  Exercise only as directed by your health care provider. Most women can continue their usual exercise routine during pregnancy. Try to exercise for 30 minutes at least 5 days a week. Stop exercising if you experience uterine contractions.  Avoid heavy lifting.  Do   not exercise in extreme heat or humidity, or at high altitudes.  Wear low-heel, comfortable shoes.  Practice good posture.  You may continue to have sex unless your health care provider tells you otherwise. Relieving pain and discomfort  Take frequent breaks and rest with your legs elevated if you have leg cramps or low back pain.  Take warm sitz baths to soothe any pain or discomfort caused by hemorrhoids. Use hemorrhoid cream if your health care provider approves.  Wear a good support bra to prevent discomfort from breast tenderness.  If you develop varicose veins: ? Wear support pantyhose or compression stockings as told by your healthcare provider. ? Elevate your feet for 15 minutes, 3-4 times a day. Prenatal care  Write down your questions. Take them to your prenatal visits.  Keep all your prenatal visits as told by your health care provider. This is important. Safety  Wear your seat belt at all times when driving.  Make  a list of emergency phone numbers, including numbers for family, friends, the hospital, and police and fire departments. General instructions  Avoid cat litter boxes and soil used by cats. These carry germs that can cause birth defects in the baby. If you have a cat, ask someone to clean the litter box for you.  Do not travel far distances unless it is absolutely necessary and only with the approval of your health care provider.  Do not use hot tubs, steam rooms, or saunas.  Do not drink alcohol.  Do not use any products that contain nicotine or tobacco, such as cigarettes and e-cigarettes. If you need help quitting, ask your health care provider.  Do not use any medicinal herbs or unprescribed drugs. These chemicals affect the formation and growth of the baby.  Do not douche or use tampons or scented sanitary pads.  Do not cross your legs for long periods of time.  To prepare for the arrival of your baby: ? Take prenatal classes to understand, practice, and ask questions about labor and delivery. ? Make a trial run to the hospital. ? Visit the hospital and tour the maternity area. ? Arrange for maternity or paternity leave through employers. ? Arrange for family and friends to take care of pets while you are in the hospital. ? Purchase a rear-facing car seat and make sure you know how to install it in your car. ? Pack your hospital bag. ? Prepare the baby's nursery. Make sure to remove all pillows and stuffed animals from the baby's crib to prevent suffocation.  Visit your dentist if you have not gone during your pregnancy. Use a soft toothbrush to brush your teeth and be gentle when you floss. Contact a health care provider if:  You are unsure if you are in labor or if your water has broken.  You become dizzy.  You have mild pelvic cramps, pelvic pressure, or nagging pain in your abdominal area.  You have lower back pain.  You have persistent nausea, vomiting, or  diarrhea.  You have an unusual or bad smelling vaginal discharge.  You have pain when you urinate. Get help right away if:  Your water breaks before 37 weeks.  You have regular contractions less than 5 minutes apart before 37 weeks.  You have a fever.  You are leaking fluid from your vagina.  You have spotting or bleeding from your vagina.  You have severe abdominal pain or cramping.  You have rapid weight loss or weight gain.  You have   shortness of breath with chest pain.  You notice sudden or extreme swelling of your face, hands, ankles, feet, or legs.  Your baby makes fewer than 10 movements in 2 hours.  You have severe headaches that do not go away when you take medicine.  You have vision changes. Summary  The third trimester is from week 28 through week 40, months 7 through 9. The third trimester is a time when the unborn baby (fetus) is growing rapidly.  During the third trimester, your discomfort may increase as you and your baby continue to gain weight. You may have abdominal, leg, and back pain, sleeping problems, and an increased need to urinate.  During the third trimester your breasts will keep growing and they will continue to become tender. A yellow fluid (colostrum) may leak from your breasts. This is the first milk you are producing for your baby.  False labor is a condition in which you feel small, irregular tightenings of the muscles in the womb (contractions) that eventually go away. These are called Braxton Hicks contractions. Contractions may last for hours, days, or even weeks before true labor sets in.  Signs of labor can include: abdominal cramps; regular contractions that start at 10 minutes apart and become stronger and more frequent with time; watery or bloody mucus discharge that comes from the vagina; increased pelvic pressure and dull back pain; and leaking of amniotic fluid. This information is not intended to replace advice given to you by your  health care provider. Make sure you discuss any questions you have with your health care provider. Document Revised: 06/08/2018 Document Reviewed: 03/23/2016 Elsevier Patient Education  2020 [employer]  

## 2020-01-18 NOTE — Progress Notes (Signed)
ROB-Pt present for routine prenatal care. Pt c/o of lower back pain, lower abd pain and cramping.

## 2020-01-18 NOTE — Progress Notes (Signed)
ROB: Does note occasional back and pelvic pain, but does not last long. Has questions about contractions, discussed Braxton Hicks vs labor contractions. Also reports some mild swelling of her feet but does resolve.  Discussed pain management in labor, will desire epidural. RTC in 2 weeks.    The following were addressed during this visit:  Breastfeeding Education - Nonpharmacological pain relief methods for labor    Comments: Deep breathing, focusing on pleasant things, movement and walking, heating pads or cold compress, massage and relaxation, continuous support from someone you trust, and Doulas   - The importance of early skin-to-skin contact    Comments: Keeps baby warm and secure, helps keep baby's blood sugar up and breathing steady, easier to bond and breastfeed, and helps calm baby.  - Rooming-in on a 24-hour basis    Comments: Easier to learn baby's feeding cues, easier to bond and get to know each other, and encourages milk production.

## 2020-01-31 ENCOUNTER — Encounter: Payer: Managed Care, Other (non HMO) | Admitting: Obstetrics and Gynecology

## 2020-01-31 ENCOUNTER — Other Ambulatory Visit: Payer: Self-pay

## 2020-01-31 ENCOUNTER — Encounter: Payer: Self-pay | Admitting: Obstetrics and Gynecology

## 2020-01-31 ENCOUNTER — Ambulatory Visit (INDEPENDENT_AMBULATORY_CARE_PROVIDER_SITE_OTHER): Payer: Managed Care, Other (non HMO) | Admitting: Obstetrics and Gynecology

## 2020-01-31 VITALS — BP 128/85 | HR 101 | Wt 218.9 lb

## 2020-01-31 DIAGNOSIS — Z3A36 36 weeks gestation of pregnancy: Secondary | ICD-10-CM

## 2020-01-31 DIAGNOSIS — Z3403 Encounter for supervision of normal first pregnancy, third trimester: Secondary | ICD-10-CM

## 2020-01-31 LAB — POCT URINALYSIS DIPSTICK OB
Bilirubin, UA: NEGATIVE
Blood, UA: NEGATIVE
Glucose, UA: NEGATIVE
Ketones, UA: NEGATIVE
Leukocytes, UA: NEGATIVE
Nitrite, UA: NEGATIVE
POC,PROTEIN,UA: NEGATIVE
Spec Grav, UA: 1.01 (ref 1.010–1.025)
Urobilinogen, UA: 0.2 E.U./dL
pH, UA: 6.5 (ref 5.0–8.0)

## 2020-01-31 NOTE — Addendum Note (Signed)
Addended by: Dorian Pod on: 01/31/2020 01:43 PM   Modules accepted: Orders

## 2020-01-31 NOTE — Progress Notes (Signed)
ROB: Patient says she is "over it".  Denies contractions.  Signs and symptoms of labor discussed.  Complains of pain shooting into the vagina and generalized pain and discomfort.  Difficulty sleeping.  Discussed induction if she goes past her due date. (12/25).  GC/CT-GBS performed.  Prepumping for breast-feeding discussed.  Patient has decided to wait.  Size date discrepancy noted.  Recheck at next visit consider ultrasound.

## 2020-01-31 NOTE — Patient Instructions (Signed)

## 2020-02-02 LAB — STREP GP B NAA: Strep Gp B NAA: NEGATIVE

## 2020-02-03 LAB — GC/CHLAMYDIA PROBE AMP
Chlamydia trachomatis, NAA: NEGATIVE
Neisseria Gonorrhoeae by PCR: NEGATIVE

## 2020-02-07 NOTE — Progress Notes (Signed)
ROB-Pt present for routine prenatal care. Pt had 36 week cultures at last visit.  Pt c/o of abd pressure/pain, lower back and braxton hick contractions. Pt stated

## 2020-02-07 NOTE — Patient Instructions (Addendum)

## 2020-02-08 ENCOUNTER — Other Ambulatory Visit: Payer: Self-pay

## 2020-02-08 ENCOUNTER — Encounter: Payer: Self-pay | Admitting: Obstetrics and Gynecology

## 2020-02-08 ENCOUNTER — Ambulatory Visit (INDEPENDENT_AMBULATORY_CARE_PROVIDER_SITE_OTHER): Payer: Managed Care, Other (non HMO) | Admitting: Obstetrics and Gynecology

## 2020-02-08 VITALS — BP 132/80 | HR 73 | Wt 220.1 lb

## 2020-02-08 DIAGNOSIS — O26893 Other specified pregnancy related conditions, third trimester: Secondary | ICD-10-CM

## 2020-02-08 DIAGNOSIS — R102 Pelvic and perineal pain: Secondary | ICD-10-CM

## 2020-02-08 DIAGNOSIS — Z3403 Encounter for supervision of normal first pregnancy, third trimester: Secondary | ICD-10-CM

## 2020-02-08 DIAGNOSIS — O479 False labor, unspecified: Secondary | ICD-10-CM

## 2020-02-08 DIAGNOSIS — Z3A37 37 weeks gestation of pregnancy: Secondary | ICD-10-CM

## 2020-02-08 LAB — POCT URINALYSIS DIPSTICK OB
Bilirubin, UA: NEGATIVE
Blood, UA: NEGATIVE
Glucose, UA: NEGATIVE
Ketones, UA: NEGATIVE
Leukocytes, UA: NEGATIVE
Nitrite, UA: NEGATIVE
POC,PROTEIN,UA: NEGATIVE
Spec Grav, UA: 1.01 (ref 1.010–1.025)
Urobilinogen, UA: 0.2 E.U./dL
pH, UA: 7 (ref 5.0–8.0)

## 2020-02-08 NOTE — Progress Notes (Signed)
ROB: Complains of pelvic pressure and Braxton Hicks. Further discussed IOL if no delivery by 41 weeks. Discussed herbal preps for cervical ripening. Also discussed labor precautions. 36 week labs negative. RTC in 1 week.

## 2020-02-14 ENCOUNTER — Encounter: Payer: Self-pay | Admitting: Obstetrics and Gynecology

## 2020-02-14 ENCOUNTER — Other Ambulatory Visit: Payer: Self-pay

## 2020-02-14 ENCOUNTER — Ambulatory Visit (INDEPENDENT_AMBULATORY_CARE_PROVIDER_SITE_OTHER): Payer: Managed Care, Other (non HMO) | Admitting: Obstetrics and Gynecology

## 2020-02-14 VITALS — BP 136/84 | HR 94 | Wt 221.4 lb

## 2020-02-14 DIAGNOSIS — Z3A38 38 weeks gestation of pregnancy: Secondary | ICD-10-CM

## 2020-02-14 DIAGNOSIS — Z3403 Encounter for supervision of normal first pregnancy, third trimester: Secondary | ICD-10-CM

## 2020-02-14 LAB — POCT URINALYSIS DIPSTICK OB
Bilirubin, UA: NEGATIVE
Blood, UA: NEGATIVE
Glucose, UA: NEGATIVE
Ketones, UA: NEGATIVE
Leukocytes, UA: NEGATIVE
Nitrite, UA: NEGATIVE
POC,PROTEIN,UA: NEGATIVE
Spec Grav, UA: 1.01 (ref 1.010–1.025)
Urobilinogen, UA: 0.2 E.U./dL
pH, UA: 6 (ref 5.0–8.0)

## 2020-02-14 NOTE — Progress Notes (Signed)
ROB: Patient continues to have some pelvic pressure with irregular contractions.  Denies leakage of fluid.  Reports daily fetal movement.  Head low in the pelvis.  Signs and symptoms of labor discussed.  Possible induction for postdates discussed.  She has had only 1 gallbladder attack in the last week when she tried to eat salad.  Otherwise doing well with this.

## 2020-02-20 ENCOUNTER — Encounter: Payer: Self-pay | Admitting: Obstetrics and Gynecology

## 2020-02-20 ENCOUNTER — Ambulatory Visit (INDEPENDENT_AMBULATORY_CARE_PROVIDER_SITE_OTHER): Payer: Managed Care, Other (non HMO) | Admitting: Obstetrics and Gynecology

## 2020-02-20 ENCOUNTER — Other Ambulatory Visit: Payer: Self-pay

## 2020-02-20 ENCOUNTER — Telehealth: Payer: Self-pay

## 2020-02-20 VITALS — BP 113/72 | HR 80 | Wt 223.1 lb

## 2020-02-20 DIAGNOSIS — O480 Post-term pregnancy: Secondary | ICD-10-CM

## 2020-02-20 DIAGNOSIS — Z3403 Encounter for supervision of normal first pregnancy, third trimester: Secondary | ICD-10-CM

## 2020-02-20 DIAGNOSIS — O479 False labor, unspecified: Secondary | ICD-10-CM

## 2020-02-20 DIAGNOSIS — Z3A39 39 weeks gestation of pregnancy: Secondary | ICD-10-CM

## 2020-02-20 LAB — POCT URINALYSIS DIPSTICK OB
Bilirubin, UA: NEGATIVE
Blood, UA: NEGATIVE
Glucose, UA: NEGATIVE
Ketones, UA: NEGATIVE
Nitrite, UA: NEGATIVE
POC,PROTEIN,UA: NEGATIVE
Spec Grav, UA: 1.015 (ref 1.010–1.025)
Urobilinogen, UA: 0.2 E.U./dL
pH, UA: 7 (ref 5.0–8.0)

## 2020-02-20 NOTE — Telephone Encounter (Signed)
She didn't mention anything to me about desiring to be placed out of work during her visit wit me today When was she trying to be taken out of work?

## 2020-02-20 NOTE — Progress Notes (Signed)
ROB-pt present for routine prenatal care.  Pt c/o lower abd, pelvic pain and possible braxton hick contractions.

## 2020-02-20 NOTE — Progress Notes (Signed)
ROB: Patient reports pelvic pain and possible Deberah Pelton. Discussed IOL by 41 weeks if no delivery for postdates. Will schedule for 12/31. RTC in 1 week for OB visit, BPP/growth scan.  Also can place foley bulb outpatient.

## 2020-02-20 NOTE — Patient Instructions (Addendum)
COVID 19 Instructions for Scheduled Procedure (Inductions/C-sections and GYN surgeries)   Thank you for choosing [facility] for your services.  You have been scheduled for a procedure called ____Induction of Labor______.    Your procedure is scheduled on ______Friday, February 29, 2020 at midnight_______________.  You are required to have COVID-19 testing performed 2 days prior to your scheduled procedure date.  Testing is performed between 9 AM and 1 PM Monday through Friday.  Please present for testing on ______Wednesday, December 29, 2021__________________ during this hour. Drive up testing is performed in front of the [facility] (this is next to the [employer]).    Upon your scheduled procedure date, you will need to arrive at the [facility] entrance. (There is a statue at the front of this entrance.)   Please arrive on time if you are scheduled for an induction of labor.   If you are scheduled for a Cesarean delivery or for Gyn Surgery, arrive 2 hours prior to your procedure time.   If you are an Obstetric patient and your arrival time falls between 11 PM and 6 AM call L&D [phone]) when you arrive.  A staff member will meet you at the [facility] entrance.  At this time, patients are allowed 1 support person to accompany them. Face masks are required for you and your support person. Your support person is now allowed to be there with you during the entire time of your admission.   Please contact the office if you have any questions regarding this information.  The [facility] office number is [phone].     Thank you,    Your [facility] Providers         OUTPATIENT FOLEY BULB INDUCTION OF LABOR:  Information Sheet for Mothers and Family               What's a Foley Bulb Induction?   A Foley bulb induction is a procedure where your provider inserts a catheter into your cervix. Once inside your womb, your provider inflates the  balloon with a saline solution.  This puts pressure on your cervix and encourages dilation. The catheter falls out once your cervix dilates to 3-4 centimeters.  Your provider will likely use this method in conjunction with labor-inducing medications such as Pitocin. With any procedure, it's important that you know what to expect. The insertion of a Foley catheter can be a bit uncomfortable, and some women experience sharp pelvic pain. The pain may subside once the catheter is in place. You may experience some cramping when the Foley catheter is in place.  This is normal.     GO TO LABOR AND DELIVERY FOR THE FOLLOWING: . Heavy vaginal bleeding . Rupture of membranes (fluid that wets your underwear) . Painful uterine contractions every 5 minutes or less . Severe abdominal discomfort . Decreased movement of the baby      Labor Induction  Labor induction is when steps are taken to cause a pregnant woman to begin the labor process. Most women go into labor on their own between 37 weeks and 42 weeks of pregnancy. When this does not happen or when there is a medical need for labor to begin, steps may be taken to induce labor. Labor induction causes a pregnant woman's uterus to contract. It also causes the cervix to soften (ripen), open (dilate), and thin out (efface). Usually, labor is not induced before 39 weeks of pregnancy unless there is a medical  reason to do so. Your health care provider will determine if labor induction is needed. Before inducing labor, your health care provider will consider a number of factors, including:  Your medical condition and your baby's.  How many weeks along you are in your pregnancy.  How mature your baby's lungs are.  The condition of your cervix.  The position of your baby.  The size of your birth canal. What are some reasons for labor induction? Labor may be induced if:  Your health or your baby's health is at risk.  Your pregnancy is overdue by 1  week or more.  Your water breaks but labor does not start on its own.  There is a low amount of amniotic fluid around your baby. You may also choose (elect) to have labor induced at a certain time. Generally, elective labor induction is done no earlier than 39 weeks of pregnancy. What methods are used for labor induction? Methods used for labor induction include:  Prostaglandin medicine. This medicine starts contractions and causes the cervix to dilate and ripen. It can be taken by mouth (orally) or by being inserted into the vagina (suppository).  Inserting a small, thin tube (catheter) with a balloon into the vagina and then expanding the balloon with water to dilate the cervix.  Stripping the membranes. In this method, your health care provider gently separates amniotic sac tissue from the cervix. This causes the cervix to stretch, which in turn causes the release of a hormone called progesterone. The hormone causes the uterus to contract. This procedure is often done during an office visit, after which you will be sent home to wait for contractions to begin.  Breaking the water. In this method, your health care provider uses a small instrument to make a small hole in the amniotic sac. This eventually causes the amniotic sac to break. Contractions should begin after a few hours.  Medicine to trigger or strengthen contractions. This medicine is given through an IV that is inserted into a vein in your arm. Except for membrane stripping, which can be done in a clinic, labor induction is done in the hospital so that you and your baby can be carefully monitored. How long does it take for labor to be induced? The length of time it takes to induce labor depends on how ready your body is for labor. Some inductions can take up to 2-3 days, while others may take less than a day. Induction may take longer if:  You are induced early in your pregnancy.  It is your first pregnancy.  Your cervix is not  ready. What are some risks associated with labor induction? Some risks associated with labor induction include:  Changes in fetal heart rate, such as being too high, too low, or irregular (erratic).  Failed induction.  Infection in the mother or the baby.  Increased risk of having a cesarean delivery.  Fetal death.  Breaking off (abruption) of the placenta from the uterus (rare).  Rupture of the uterus (very rare). When induction is needed for medical reasons, the benefits of induction generally outweigh the risks. What are some reasons for not inducing labor? Labor induction should not be done if:  Your baby does not tolerate contractions.  You have had previous surgeries on your uterus, such as a myomectomy, removal of fibroids, or a vertical scar from a previous cesarean delivery.  Your placenta lies very low in your uterus and blocks the opening of the cervix (placenta previa).  Your  baby is not in a head-down position.  The umbilical cord drops down into the birth canal in front of the baby.  There are unusual circumstances, such as the baby being very early (premature).  You have had more than 2 previous cesarean deliveries. Summary  Labor induction is when steps are taken to cause a pregnant woman to begin the labor process.  Labor induction causes a pregnant woman's uterus to contract. It also causes the cervix to ripen, dilate, and efface.  Labor is not induced before 39 weeks of pregnancy unless there is a medical reason to do so.  When induction is needed for medical reasons, the benefits of induction generally outweigh the risks. This information is not intended to replace advice given to you by your health care provider. Make sure you discuss any questions you have with your health care provider. Document Revised: 02/18/2017 Document Reviewed: 03/31/2016 Elsevier Patient Education  2020 [employer].

## 2020-02-20 NOTE — Telephone Encounter (Signed)
Pt was checking out and was requesting to know, what we can do for her.  The pts work said that she cant be out of work till the baby is delivered. I told the pt I will send a message to the nurse. Pt is wanting to know if we can send a work note. Please advise

## 2020-02-20 NOTE — Telephone Encounter (Signed)
Please advise. Thanks Markevious Ehmke 

## 2020-02-21 NOTE — Telephone Encounter (Signed)
Pt called no answer 

## 2020-02-23 ENCOUNTER — Inpatient Hospital Stay: Admit: 2020-02-23 | Payer: Self-pay

## 2020-02-26 ENCOUNTER — Telehealth: Payer: Self-pay

## 2020-02-26 ENCOUNTER — Observation Stay (HOSPITAL_BASED_OUTPATIENT_CLINIC_OR_DEPARTMENT_OTHER)
Admission: EM | Admit: 2020-02-26 | Discharge: 2020-02-27 | Disposition: A | Discharge status: Home / Self Care | Payer: Managed Care, Other (non HMO) | Source: Home / Self Care | Admitting: Obstetrics and Gynecology

## 2020-02-26 ENCOUNTER — Encounter: Payer: Self-pay | Admitting: Obstetrics and Gynecology

## 2020-02-26 ENCOUNTER — Other Ambulatory Visit: Payer: Self-pay

## 2020-02-26 ENCOUNTER — Ambulatory Visit (INDEPENDENT_AMBULATORY_CARE_PROVIDER_SITE_OTHER): Payer: Managed Care, Other (non HMO) | Admitting: Obstetrics and Gynecology

## 2020-02-26 ENCOUNTER — Observation Stay (HOSPITAL_BASED_OUTPATIENT_CLINIC_OR_DEPARTMENT_OTHER)
Admission: EM | Admit: 2020-02-26 | Discharge: 2020-02-26 | Disposition: A | Discharge status: Home / Self Care | Payer: Managed Care, Other (non HMO) | Source: Home / Self Care | Admitting: Obstetrics and Gynecology

## 2020-02-26 VITALS — BP 130/87 | HR 108 | Wt 222.9 lb

## 2020-02-26 DIAGNOSIS — R109 Unspecified abdominal pain: Secondary | ICD-10-CM | POA: Insufficient documentation

## 2020-02-26 DIAGNOSIS — O26893 Other specified pregnancy related conditions, third trimester: Secondary | ICD-10-CM | POA: Insufficient documentation

## 2020-02-26 DIAGNOSIS — Z3A40 40 weeks gestation of pregnancy: Secondary | ICD-10-CM | POA: Insufficient documentation

## 2020-02-26 DIAGNOSIS — Z3403 Encounter for supervision of normal first pregnancy, third trimester: Secondary | ICD-10-CM

## 2020-02-26 DIAGNOSIS — O480 Post-term pregnancy: Secondary | ICD-10-CM

## 2020-02-26 DIAGNOSIS — Z3490 Encounter for supervision of normal pregnancy, unspecified, unspecified trimester: Secondary | ICD-10-CM

## 2020-02-26 LAB — POCT URINALYSIS DIPSTICK OB
Bilirubin, UA: NEGATIVE
Glucose, UA: NEGATIVE
Ketones, UA: NEGATIVE
Leukocytes, UA: NEGATIVE
Nitrite, UA: NEGATIVE
POC,PROTEIN,UA: NEGATIVE
Spec Grav, UA: 1.01 (ref 1.010–1.025)
Urobilinogen, UA: 0.2 E.U./dL
pH, UA: 7 (ref 5.0–8.0)

## 2020-02-26 LAB — RUPTURE OF MEMBRANE (ROM)PLUS: Rom Plus: NEGATIVE

## 2020-02-26 MED ORDER — HYDROXYZINE HCL 50 MG/ML IM SOLN
50.0000 mg | Freq: Once | INTRAMUSCULAR | Status: AC
  Administered 2020-02-27: 50 mg via INTRAMUSCULAR
  Filled 2020-02-26: qty 1

## 2020-02-26 NOTE — OB Triage Note (Signed)
Patient is 24 yo, G1P0, [redacted]w[redacted]d, presenting with complaint of lower abdominal pain rated at a 7/10 that started around 2100 on 02/25/20. Patient denies any LOF or vaginal bleeding. Patient reports + fetal movement. RN performed cervical exam, 3/70/-3. VSS, monitors applied, will continue to monitor.

## 2020-02-26 NOTE — Telephone Encounter (Signed)
Spoke with patient and she has been she had loss of fluid 2-3 times this morning. She has been having contractions off and on about 10-15 minutes apart all day today. Patient states that they are strong and stop her in her tracks. I have spoke with Dr. Logan Bores about this and he has agreed to see patient in the clinic. I have called and scheduled patient.

## 2020-02-26 NOTE — Discharge Summary (Signed)
RN reviewed discharge instructions with patient. Gave patient opportunity for questions. All questions answered at this time. Patient verbalized understanding. Patient discharged home with significant other.

## 2020-02-26 NOTE — Telephone Encounter (Signed)
Patient called in stating she went to the ED last night and states that she is not feeling any better today. Patient states she is still having vaginal bleeding and having abdominal pain. Patient states that when she went last night they told her she was 3 cm dialated and was having contractions 10 minutes apart. Could you please advise?

## 2020-02-26 NOTE — OB Triage Note (Signed)
G1 P) at 40 and 3   here for large gush of fluid while getting in the shower. Felicia Curtis states that the gush was clear, she did not have to wear a pad and pants are dry upon arrival to hospital. She also states that she has been contracting all day but they have been getting worse and stopping her in her tracks. She is having some bleeding but it is not red in color more brown. She was here in triage early this morning and saw dr Logan Bores in the office today where her cervical check remained the same.

## 2020-02-26 NOTE — Progress Notes (Signed)
ROB: Patient complained of leaking fluid and continued to have contractions approximately every 10 minutes.  Nitrazine negative ferning negative.  Cervix unchanged from earlier today.  NST earlier today reactive.  Signs symptoms of labor discussed.  Patient to keep appointment for BPP and OB check tomorrow.

## 2020-02-27 ENCOUNTER — Ambulatory Visit (INDEPENDENT_AMBULATORY_CARE_PROVIDER_SITE_OTHER): Payer: Managed Care, Other (non HMO) | Admitting: Obstetrics and Gynecology

## 2020-02-27 ENCOUNTER — Inpatient Hospital Stay: Payer: Managed Care, Other (non HMO) | Admitting: Certified Registered Nurse Anesthetist

## 2020-02-27 ENCOUNTER — Other Ambulatory Visit: Payer: Self-pay

## 2020-02-27 ENCOUNTER — Inpatient Hospital Stay
Admission: EM | Admit: 2020-02-27 | Discharge: 2020-02-28 | DRG: 806 | Disposition: A | Discharge status: Home / Self Care | Payer: Managed Care, Other (non HMO) | Attending: Obstetrics and Gynecology | Admitting: Obstetrics and Gynecology

## 2020-02-27 ENCOUNTER — Encounter: Payer: Self-pay | Admitting: Obstetrics and Gynecology

## 2020-02-27 ENCOUNTER — Encounter: Payer: Self-pay | Admitting: Anesthesiology

## 2020-02-27 ENCOUNTER — Ambulatory Visit: Payer: Managed Care, Other (non HMO)

## 2020-02-27 DIAGNOSIS — K8020 Calculus of gallbladder without cholecystitis without obstruction: Secondary | ICD-10-CM | POA: Diagnosis present

## 2020-02-27 DIAGNOSIS — O480 Post-term pregnancy: Secondary | ICD-10-CM

## 2020-02-27 DIAGNOSIS — Z3403 Encounter for supervision of normal first pregnancy, third trimester: Secondary | ICD-10-CM

## 2020-02-27 DIAGNOSIS — O2662 Liver and biliary tract disorders in childbirth: Secondary | ICD-10-CM | POA: Diagnosis present

## 2020-02-27 DIAGNOSIS — Z3A40 40 weeks gestation of pregnancy: Secondary | ICD-10-CM | POA: Diagnosis not present

## 2020-02-27 DIAGNOSIS — Z20822 Contact with and (suspected) exposure to covid-19: Secondary | ICD-10-CM | POA: Diagnosis present

## 2020-02-27 DIAGNOSIS — O26893 Other specified pregnancy related conditions, third trimester: Secondary | ICD-10-CM | POA: Diagnosis present

## 2020-02-27 LAB — CBC WITH DIFFERENTIAL/PLATELET
Abs Immature Granulocytes: 0.16 10*3/uL — ABNORMAL HIGH (ref 0.00–0.07)
Basophils Absolute: 0.1 10*3/uL (ref 0.0–0.1)
Basophils Relative: 0 %
Eosinophils Absolute: 0 10*3/uL (ref 0.0–0.5)
Eosinophils Relative: 0 %
HCT: 39.2 % (ref 36.0–46.0)
Hemoglobin: 13.3 g/dL (ref 12.0–15.0)
Immature Granulocytes: 1 %
Lymphocytes Relative: 8 %
Lymphs Abs: 1.5 10*3/uL (ref 0.7–4.0)
MCH: 30.2 pg (ref 26.0–34.0)
MCHC: 33.9 g/dL (ref 30.0–36.0)
MCV: 89.1 fL (ref 80.0–100.0)
Monocytes Absolute: 0.7 10*3/uL (ref 0.1–1.0)
Monocytes Relative: 3 %
Neutro Abs: 17.6 10*3/uL — ABNORMAL HIGH (ref 1.7–7.7)
Neutrophils Relative %: 88 %
Platelets: 186 10*3/uL (ref 150–400)
RBC: 4.4 MIL/uL (ref 3.87–5.11)
RDW: 14.1 % (ref 11.5–15.5)
WBC: 20 10*3/uL — ABNORMAL HIGH (ref 4.0–10.5)
nRBC: 0 % (ref 0.0–0.2)

## 2020-02-27 LAB — RESP PANEL BY RT-PCR (FLU A&B, COVID) ARPGX2
Influenza A by PCR: NEGATIVE
Influenza B by PCR: NEGATIVE
SARS Coronavirus 2 by RT PCR: NEGATIVE

## 2020-02-27 LAB — TYPE AND SCREEN
ABO/RH(D): O POS
Antibody Screen: NEGATIVE

## 2020-02-27 LAB — ABO/RH: ABO/RH(D): O POS

## 2020-02-27 MED ORDER — ONDANSETRON HCL 4 MG/2ML IJ SOLN: 4.0000 mg | INTRAMUSCULAR | Status: DC | PRN

## 2020-02-27 MED ORDER — SOD CITRATE-CITRIC ACID 500-334 MG/5ML PO SOLN: 30.0000 mL | ORAL | Status: DC | PRN

## 2020-02-27 MED ORDER — LACTATED RINGERS IV SOLN
500.0000 mL | Freq: Once | INTRAVENOUS | Status: AC
  Administered 2020-02-27: 12:00:00 500 mL via INTRAVENOUS

## 2020-02-27 MED ORDER — LIDOCAINE-EPINEPHRINE (PF) 1.5 %-1:200000 IJ SOLN
INTRAMUSCULAR | Status: DC | PRN
  Administered 2020-02-27: 3 mL via PERINEURAL

## 2020-02-27 MED ORDER — BUPIVACAINE HCL (PF) 0.25 % IJ SOLN
INTRAMUSCULAR | Status: DC | PRN
  Administered 2020-02-27: 5 mL via EPIDURAL
  Administered 2020-02-27: 3 mL via EPIDURAL

## 2020-02-27 MED ORDER — PHENYLEPHRINE 40 MCG/ML (10ML) SYRINGE FOR IV PUSH (FOR BLOOD PRESSURE SUPPORT)
80.0000 ug | PREFILLED_SYRINGE | INTRAVENOUS | Status: DC | PRN
  Filled 2020-02-27: qty 10

## 2020-02-27 MED ORDER — LIDOCAINE HCL (PF) 1 % IJ SOLN: 30.0000 mL | INTRAMUSCULAR | Status: DC | PRN

## 2020-02-27 MED ORDER — LIDOCAINE HCL (PF) 1 % IJ SOLN
INTRAMUSCULAR | Status: DC | PRN
  Administered 2020-02-27 (×2): 3 mL

## 2020-02-27 MED ORDER — ONDANSETRON HCL 4 MG PO TABS: 4.0000 mg | ORAL_TABLET | ORAL | Status: DC | PRN

## 2020-02-27 MED ORDER — OXYTOCIN-SODIUM CHLORIDE 30-0.9 UT/500ML-% IV SOLN
2.5000 [IU]/h | INTRAVENOUS | Status: DC
  Administered 2020-02-27: 17:00:00 2.5 [IU]/h via INTRAVENOUS
  Filled 2020-02-27: qty 1000

## 2020-02-27 MED ORDER — DIPHENHYDRAMINE HCL 25 MG PO CAPS: 25.0000 mg | ORAL_CAPSULE | Freq: Four times a day (QID) | ORAL | Status: DC | PRN

## 2020-02-27 MED ORDER — FENTANYL 2.5 MCG/ML W/ROPIVACAINE 0.15% IN NS 100 ML EPIDURAL (ARMC)
12.0000 mL/h | EPIDURAL | Status: DC
Start: 2020-02-27 — End: 2020-02-27
  Administered 2020-02-27: 13:00:00 12 mL/h via EPIDURAL

## 2020-02-27 MED ORDER — ZOLPIDEM TARTRATE 5 MG PO TABS: 5.0000 mg | ORAL_TABLET | Freq: Every evening | ORAL | Status: DC | PRN

## 2020-02-27 MED ORDER — ONDANSETRON HCL 4 MG/2ML IJ SOLN: 4.0000 mg | Freq: Four times a day (QID) | INTRAMUSCULAR | Status: DC | PRN

## 2020-02-27 MED ORDER — EPHEDRINE 5 MG/ML INJ
10.0000 mg | INTRAVENOUS | Status: DC | PRN
  Filled 2020-02-27: qty 2

## 2020-02-27 MED ORDER — PRENATAL MULTIVITAMIN CH
1.0000 | ORAL_TABLET | Freq: Every day | ORAL | Status: DC
  Administered 2020-02-28: 12:00:00 1 via ORAL
  Filled 2020-02-27: qty 1

## 2020-02-27 MED ORDER — COCONUT OIL OIL
1.0000 | TOPICAL_OIL | Status: DC | PRN
Start: 2020-02-27 — End: 2020-02-28

## 2020-02-27 MED ORDER — MISOPROSTOL 200 MCG PO TABS
ORAL_TABLET | ORAL | Status: AC
  Filled 2020-02-27: qty 4

## 2020-02-27 MED ORDER — SIMETHICONE 80 MG PO CHEW
80.0000 mg | CHEWABLE_TABLET | ORAL | Status: DC | PRN
  Administered 2020-02-28: 80 mg via ORAL
  Filled 2020-02-27: qty 1

## 2020-02-27 MED ORDER — ACETAMINOPHEN 325 MG PO TABS: 650.0000 mg | ORAL_TABLET | ORAL | Status: DC | PRN

## 2020-02-27 MED ORDER — LACTATED RINGERS IV SOLN: INTRAVENOUS | Status: DC

## 2020-02-27 MED ORDER — DIBUCAINE (PERIANAL) 1 % EX OINT: 1.0000 "application " | TOPICAL_OINTMENT | CUTANEOUS | Status: DC | PRN

## 2020-02-27 MED ORDER — FENTANYL 2.5 MCG/ML W/ROPIVACAINE 0.15% IN NS 100 ML EPIDURAL (ARMC)
EPIDURAL | Status: AC
  Filled 2020-02-27: qty 100

## 2020-02-27 MED ORDER — SENNOSIDES-DOCUSATE SODIUM 8.6-50 MG PO TABS
2.0000 | ORAL_TABLET | Freq: Every day | ORAL | Status: DC
  Administered 2020-02-28: 12:00:00 2 via ORAL
  Filled 2020-02-27: qty 2

## 2020-02-27 MED ORDER — BENZOCAINE-MENTHOL 20-0.5 % EX AERO: 1.0000 "application " | INHALATION_SPRAY | CUTANEOUS | Status: DC | PRN

## 2020-02-27 MED ORDER — OXYTOCIN BOLUS FROM INFUSION
333.0000 mL | Freq: Once | INTRAVENOUS | Status: AC
  Administered 2020-02-27: 16:00:00 333 mL via INTRAVENOUS

## 2020-02-27 MED ORDER — IBUPROFEN 600 MG PO TABS
600.0000 mg | ORAL_TABLET | Freq: Four times a day (QID) | ORAL | Status: DC
  Administered 2020-02-27 – 2020-02-28 (×3): 600 mg via ORAL
  Filled 2020-02-27 (×3): qty 1

## 2020-02-27 MED ORDER — LACTATED RINGERS IV SOLN: 500.0000 mL | INTRAVENOUS | Status: DC | PRN

## 2020-02-27 MED ORDER — DIPHENHYDRAMINE HCL 50 MG/ML IJ SOLN: 12.5000 mg | INTRAMUSCULAR | Status: DC | PRN

## 2020-02-27 MED ORDER — ACETAMINOPHEN 325 MG PO TABS
650.0000 mg | ORAL_TABLET | ORAL | Status: DC | PRN
Start: 2020-02-27 — End: 2020-02-28
  Administered 2020-02-27 – 2020-02-28 (×3): 650 mg via ORAL
  Filled 2020-02-27 (×3): qty 2

## 2020-02-27 MED ORDER — WITCH HAZEL-GLYCERIN EX PADS: 1.0000 "application " | MEDICATED_PAD | CUTANEOUS | Status: DC | PRN

## 2020-02-27 NOTE — H&P (Signed)
Obstetric History and Physical  Felicia Curtis is a 24 y.o. G1P0 with IUP at [redacted]w[redacted]d presenting from clinic due to active labor with advanced cervical dilation. Patient states she has been having  regular, every 2-3 minutes contractions since this morning, no vaginal bleeding, questionable membrane status (feels like she may have ruptured her membranes but was seen in triage earlier this morning with negative ROM plus testing). She notes active fetal movement.    Of note, patient was seen in triage early this morning for contractions and possible ROM. Was still noted to be 3 cm, with negative ROM plus testing.  In clinic, patient was noted to be 8/100/+1 station, with no forebag noted. Was sent urgently to Labor and Delivery.   Prenatal Course Source of Care: [facility] with onset of care at 9 weeks Pregnancy complications or risks: Patient Active Problem List   Diagnosis Date Noted  . Pregnancy 02/26/2020  . Indication for care in labor and delivery, antepartum 02/26/2020  . Gallstones 11/07/2019  . Abdominal pain during pregnancy, second trimester 11/01/2019  . Impingement syndrome of shoulder region 03/17/2015   She plans to breastfeed She desires unsure method for postpartum contraception.   Prenatal labs and studies: ABO, Rh: --/--/O POS (12/29 1053) Antibody: NEG (12/29 1053) Rubella: 1.11 (05/21 1118) RPR: Non Reactive (09/28 0827)  HBsAg: Negative (05/21 1118)  HIV: Non Reactive (05/21 1118)  UGQ:BVQXIHWT/-- (12/02 1352) 1 hr Glucola  normal Genetic screening normal Anatomy US normal   Past Medical History:  Diagnosis Date  . Acute appendicitis 09/20/2013  . Gallstones 11/07/2019    Past Surgical History:  Procedure Laterality Date  . APPENDECTOMY  08/2013    OB History  Gravida Para Term Preterm AB Living  1            SAB IAB Ectopic Multiple Live Births               # Outcome Date GA Lbr Len/2nd Weight Sex Delivery Anes PTL Lv  1 Current              Social History   Socioeconomic History  . Marital status: Married    Spouse name: Johna Sheriff  . Number of children: Not on file  . Years of education: Not on file  . Highest education level: Not on file  Occupational History  . Not on file  Tobacco Use  . Smoking status: Never Smoker  . Smokeless tobacco: Never Used  Vaping Use  . Vaping Use: Never used  Substance and Sexual Activity  . Alcohol use: No  . Drug use: Never  . Sexual activity: Yes    Comment: Undecided  Other Topics Concern  . Not on file  Social History Narrative  . Not on file   Social Determinants of Health   Financial Resource Strain: Not on file  Food Insecurity: Not on file  Transportation Needs: Not on file  Physical Activity: Not on file  Stress: Not on file  Social Connections: Not on file    Family History  Problem Relation Age of Onset  . Healthy Mother   . Healthy Father     Medications Prior to Admission  Medication Sig Dispense Refill Last Dose  . acetaminophen (TYLENOL) 500 MG tablet Take 1,000 mg by mouth every 6 (six) hours as needed.    at 0700  . Prenatal Vit-Fe Fumarate-FA (MULTIVITAMIN-PRENATAL) 27-0.8 MG TABS tablet Take 1 tablet by mouth daily at 12 noon.   02/26/2020  at Unknown time    Allergies  Allergen Reactions  . Sulfa Antibiotics Rash    Review of Systems: Negative except for what is mentioned in HPI.  Physical Exam: BP 123/67   Pulse 99   Temp 97.6 F (36.4 C) (Oral)   Resp 20   Ht 5\' 6"  (1.676 m)   Wt 100.7 kg   LMP 05/13/2019   SpO2 99%   BMI 35.83 kg/m  CONSTITUTIONAL: Well-developed, well-nourished female in moderate distress with contractions.  HENT:  Normocephalic, atraumatic, External right and left ear normal. Oropharynx is clear and moist EYES: Conjunctivae and EOM are normal. Pupils are equal, round, and reactive to light. No scleral icterus.  NECK: Normal range of motion, supple, no masses SKIN: Skin is warm and dry. No rash noted. Not  diaphoretic. No erythema. No pallor. NEUROLOGIC: Alert and oriented to person, place, and time. Normal reflexes, muscle tone coordination. No cranial nerve deficit noted. PSYCHIATRIC: Normal mood and affect. Normal behavior. Normal judgment and thought content. CARDIOVASCULAR: Normal heart rate noted, regular rhythm RESPIRATORY: Effort and breath sounds normal, no problems with respiration noted ABDOMEN: Soft, nontender, nondistended, gravid. MUSCULOSKELETAL: Normal range of motion. No edema and no tenderness. 2+ distal pulses.  Cervical Exam: Dilatation 9 cm   Effacement 100%   Station +1   Presentation: cephalic FHT:  Baseline rate 135 bpm   Variability moderate  Accelerations present   Decelerations none Contractions: Every 1-3 mins   Pertinent Labs/Studies:   Results for orders placed or performed during the hospital encounter of 02/27/20 (from the past 24 hour(s))  Resp Panel by RT-PCR (Flu A&B, Covid) Nasopharyngeal Swab     Status: None   Collection Time: 02/27/20 10:52 AM   Specimen: Nasopharyngeal Swab; Nasopharyngeal(NP) swabs in vial transport medium  Result Value Ref Range   SARS Coronavirus 2 by RT PCR NEGATIVE NEGATIVE   Influenza A by PCR NEGATIVE NEGATIVE   Influenza B by PCR NEGATIVE NEGATIVE  CBC with Differential/Platelet     Status: Abnormal   Collection Time: 02/27/20 10:53 AM  Result Value Ref Range   WBC 20.0 (H) 4.0 - 10.5 K/uL   RBC 4.40 3.87 - 5.11 MIL/uL   Hemoglobin 13.3 12.0 - 15.0 g/dL   HCT 02/29/20 21.1 - 94.1 %   MCV 89.1 80.0 - 100.0 fL   MCH 30.2 26.0 - 34.0 pg   MCHC 33.9 30.0 - 36.0 g/dL   RDW 74.0 81.4 - 48.1 %   Platelets 186 150 - 400 K/uL   nRBC 0.0 0.0 - 0.2 %   Neutrophils Relative % 88 %   Neutro Abs 17.6 (H) 1.7 - 7.7 K/uL   Lymphocytes Relative 8 %   Lymphs Abs 1.5 0.7 - 4.0 K/uL   Monocytes Relative 3 %   Monocytes Absolute 0.7 0.1 - 1.0 K/uL   Eosinophils Relative 0 %   Eosinophils Absolute 0.0 0.0 - 0.5 K/uL   Basophils Relative  0 %   Basophils Absolute 0.1 0.0 - 0.1 K/uL   Immature Granulocytes 1 %   Abs Immature Granulocytes 0.16 (H) 0.00 - 0.07 K/uL  Type and screen [facility]     Status: None   Collection Time: 02/27/20 10:53 AM  Result Value Ref Range   ABO/RH(D) O POS    Antibody Screen NEG    Sample Expiration      03/01/2020,2359 Performed at [facility], [address], [place], [place]     Assessment :  Felicia Curtis is a 24 y.o. G1P0 at [redacted]w[redacted]d being admitted for labor. Post-dates pregnancy.   Plan: Labor: Expectant management.  Augmentation if needed with Pitocin per protocol. Analgesia as needed. Desires epidural, Anesthesiologist in room with patient currently.  FWB: Reassuring fetal heart tracing.  GBS negative Delivery plan: Hopeful for vaginal delivery    Hildred Laser, MD [facility]

## 2020-02-27 NOTE — Progress Notes (Signed)
Patient discharged home after IM vistaril. Given labor precautions and verbalized understating of when to return. Will follow up with Dr Valentino Saxon tomorrow in office. Scheduled for induction Thursday.

## 2020-02-27 NOTE — Anesthesia Preprocedure Evaluation (Signed)
Anesthesia Evaluation  Patient identified by MRN, date of birth, ID band Patient awake    Reviewed: Allergy & Precautions, H&P , NPO status , Patient's Chart, lab work & pertinent test results  Airway Mallampati: II  TM Distance: >3 FB Neck ROM: full    Dental no notable dental hx.    Pulmonary neg pulmonary ROS,    Pulmonary exam normal        Cardiovascular negative cardio ROS Normal cardiovascular exam     Neuro/Psych negative neurological ROS  negative psych ROS   GI/Hepatic negative GI ROS, Neg liver ROS,   Endo/Other  negative endocrine ROS  Renal/GU negative Renal ROS  negative genitourinary   Musculoskeletal   Abdominal   Peds  Hematology negative hematology ROS (+)   Anesthesia Other Findings   Reproductive/Obstetrics (+) Pregnancy                             Anesthesia Physical Anesthesia Plan  ASA: II  Anesthesia Plan: Regional   Post-op Pain Management:    Induction:   PONV Risk Score and Plan:   Airway Management Planned:   Additional Equipment:   Intra-op Plan:   Post-operative Plan:   Informed Consent: I have reviewed the patients History and Physical, chart, labs and discussed the procedure including the risks, benefits and alternatives for the proposed anesthesia with the patient or authorized representative who has indicated his/her understanding and acceptance.       Plan Discussed with: Anesthesiologist and CRNA  Anesthesia Plan Comments:         Anesthesia Quick Evaluation

## 2020-02-27 NOTE — Progress Notes (Signed)
ROB: Patient's husband notes patient's ctx are 2-3 min apart. Was seen in triage overnight for labor eval and possible ROM, but was ruled out.  Exam today notes cervix 8-9/100/+1. Will send urgently to labor and delivery for further management.

## 2020-02-27 NOTE — Final Progress Note (Signed)
L&D OB Triage Note  Felicia Curtis is a 24 y.o. G1P0 female at [redacted]w[redacted]d, EDD Estimated Date of Delivery: 02/23/20 who presented to triage for complaints of contractions, ~ 10 min apart and possible ROM. She was evaluated by the nurses, with no findings significant for labor or ruptured membranes. Vital signs stable. An NST was performed and has been reviewed by MD. She was treated with Vistaril 50 mg PO to manage prodromal labor symptoms.    Physical Exam:  Blood pressure 135/85, pulse (!) 105, last menstrual period 05/13/2019.  Dilation: 3 Effacement (%): 80 Station: -2 Exam by:: BN  Cervix unchanged after 1.5 hrs.    NST INTERPRETATION: Indications: rule out uterine contractions  Mode: External Baseline Rate (A): 130 bpm Variability: Moderate Accelerations: 15 x 15 Decelerations: None     Contraction Frequency (min): 8-10  Impression: reactive   Plan: NST performed was reviewed and was found to be reactive. She was discharged home with bleeding/labor precautions.  Continue routine prenatal care. Follow up with OB/GYN as previously scheduled. Has appointment tomorrow.     Hildred Laser, MD  [facility]

## 2020-02-27 NOTE — Anesthesia Procedure Notes (Signed)
Epidural Patient location during procedure: OB Start time: 02/27/2020 12:07 PM End time: 02/27/2020 12:36 PM  Staffing Anesthesiologist: Piscitello, Cleda Mccreedy, MD Resident/CRNA: Jeanine Luz, CRNA Performed: resident/CRNA   Preanesthetic Checklist Completed: patient identified, IV checked, site marked, risks and benefits discussed, surgical consent, monitors and equipment checked and pre-op evaluation  Epidural Patient position: sitting Prep: ChloraPrep Patient monitoring: heart rate, continuous pulse ox and blood pressure Approach: midline Location: L3-L4 Injection technique: LOR air  Needle:  Needle type: Tuohy  Needle gauge: 17 G Needle length: 9 cm and 9 Needle insertion depth: 7 cm Catheter type: closed end flexible Catheter size: 19 Gauge Catheter at skin depth: 15 cm Test dose: negative and 1.5% lidocaine with Epi 1:200 K  Assessment Events: blood not aspirated, injection not painful, no injection resistance, no paresthesia and negative IV test  Additional Notes 2 attempt Pt. Evaluated and documentation done after procedure finished. Patient identified. Risks/Benefits/Options discussed with patient including but not limited to bleeding, infection, nerve damage, paralysis, failed block, incomplete pain control, headache, blood pressure changes, nausea, vomiting, reactions to medication both or allergic, itching and postpartum back pain. Confirmed with bedside nurse the patient's most recent platelet count. Confirmed with patient that they are not currently taking any anticoagulation, have any bleeding history or any family history of bleeding disorders. Patient expressed understanding and wished to proceed. All questions were answered. Sterile technique was used throughout the entire procedure. Please see nursing notes for vital signs. Test dose was given through epidural catheter and negative prior to continuing to dose epidural or start infusion. Warning signs of high  block given to the patient including shortness of breath, tingling/numbness in hands, complete motor block, or any concerning symptoms with instructions to call for help. Patient was given instructions on fall risk and not to get out of bed. All questions and concerns addressed with instructions to call with any issues or inadequate analgesia.   Patient tolerated the insertion well without immediate complications.Reason for block:procedure for pain

## 2020-02-27 NOTE — Discharge Summary (Signed)
    L&D OB Triage Note  SUBJECTIVE Felicia Curtis is a 24 y.o. G1P0 female at [redacted]w[redacted]d, EDD Estimated Date of Delivery: 02/23/20 who presented to triage with complaints of abdominal pain.  She says the pain is intermittent.  Not timing contractions.  Patient denies any LOF or vaginal bleeding. Patient reports fetal movement.  OB History  Gravida Para Term Preterm AB Living  1 0 0 0 0 0  SAB IAB Ectopic Multiple Live Births  0 0 0 0 0    # Outcome Date GA Lbr Len/2nd Weight Sex Delivery Anes PTL Lv  1 Current             No medications prior to admission.     OBJECTIVE  Nursing Evaluation:   BP 136/90 (BP Location: Right Arm)   Pulse 87   Temp 97.7 F (36.5 C) (Oral)   Resp 18   Ht 5\' 6"  (1.676 m)   Wt 101.6 kg   LMP 05/13/2019   BMI 36.15 kg/m    Findings:        Patient not in labor -contractions remain irregular without cervical change.      NST was performed and has been reviewed by me.  NST INTERPRETATION: Category I  Mode: External Baseline Rate (A): 130 bpm Variability: Moderate Accelerations: 15 x 15 Decelerations: None     Contraction Frequency (min): UI  ASSESSMENT Impression:  1.  Pregnancy:  G1P0 at [redacted]w[redacted]d , EDD Estimated Date of Delivery: 02/23/20 2.  Reassuring fetal and maternal status 3.  Not in labor.  No cervical change with irregular contractions. No evidence of ROM.  PLAN 1. Discussed current condition and above findings with patient and reassurance given.  All questions answered. 2. Discharge home with standard labor precautions given to return to L&D or call the office for problems. 3. Continue routine prenatal care.

## 2020-02-27 NOTE — Anesthesia Preprocedure Evaluation (Signed)
Anesthesia Evaluation  Patient identified by MRN, date of birth, ID band Patient awake    Reviewed: Allergy & Precautions, H&P , NPO status , Patient's Chart, lab work & pertinent test results, reviewed documented beta blocker date and time   Airway Mallampati: II  TM Distance: >3 FB Neck ROM: full    Dental no notable dental hx. (+) Teeth Intact   Pulmonary neg pulmonary ROS, Current Smoker,    Pulmonary exam normal breath sounds clear to auscultation       Cardiovascular Exercise Tolerance: Good negative cardio ROS   Rhythm:regular Rate:Normal     Neuro/Psych negative neurological ROS  negative psych ROS   GI/Hepatic negative GI ROS, Neg liver ROS,   Endo/Other  negative endocrine ROSdiabetes, Gestational  Renal/GU      Musculoskeletal   Abdominal   Peds  Hematology negative hematology ROS (+)   Anesthesia Other Findings   Reproductive/Obstetrics (+) Pregnancy                             Anesthesia Physical Anesthesia Plan  ASA: II  Anesthesia Plan: Epidural   Post-op Pain Management:    Induction:   PONV Risk Score and Plan:   Airway Management Planned:   Additional Equipment:   Intra-op Plan:   Post-operative Plan:   Informed Consent: I have reviewed the patients History and Physical, chart, labs and discussed the procedure including the risks, benefits and alternatives for the proposed anesthesia with the patient or authorized representative who has indicated his/her understanding and acceptance.       Plan Discussed with:   Anesthesia Plan Comments:         Anesthesia Quick Evaluation  

## 2020-02-28 LAB — RPR: RPR Ser Ql: NONREACTIVE

## 2020-02-28 LAB — CBC
HCT: 32.6 % — ABNORMAL LOW (ref 36.0–46.0)
Hemoglobin: 11.2 g/dL — ABNORMAL LOW (ref 12.0–15.0)
MCH: 30.6 pg (ref 26.0–34.0)
MCHC: 34.4 g/dL (ref 30.0–36.0)
MCV: 89.1 fL (ref 80.0–100.0)
Platelets: 178 10*3/uL (ref 150–400)
RBC: 3.66 MIL/uL — ABNORMAL LOW (ref 3.87–5.11)
RDW: 14.4 % (ref 11.5–15.5)
WBC: 19.2 10*3/uL — ABNORMAL HIGH (ref 4.0–10.5)
nRBC: 0 % (ref 0.0–0.2)

## 2020-02-28 MED ORDER — IBUPROFEN 800 MG PO TABS: 800.0000 mg | ORAL_TABLET | Freq: Three times a day (TID) | ORAL | 1 refills | Status: DC | PRN

## 2020-02-28 NOTE — Progress Notes (Signed)
Post Partum Day # 1, s/p SVD  Subjective: no complaints, up ad lib, voiding and tolerating PO. Desires to d/c home today if possible.   Objective: Temp:  [97.7 F (36.5 C)-98.9 F (37.2 C)] 98.9 F (37.2 C) (12/30 1100) Pulse Rate:  [75-129] 88 (12/30 1100) Resp:  [18-20] 18 (12/30 1100) BP: (98-128)/(51-104) 111/71 (12/30 1100) SpO2:  [95 %-100 %] 98 % (12/30 0803)  Physical Exam:  General: alert and no distress  Lungs: clear to auscultation bilaterally Breasts: normal appearance, no masses or tenderness Heart: regular rate and rhythm, S1, S2 normal, no murmur, click, rub or gallop Abdomen: soft, non-tender; bowel sounds normal; no masses,  no organomegaly Pelvis: Lochia: appropriate, Uterine Fundus: firm Extremities: DVT Evaluation: No evidence of DVT seen on physical exam. Negative Homan's sign. No cords or calf tenderness. No significant calf/ankle edema.  Recent Labs    02/27/20 1053 02/28/20 0437  HGB 13.3 11.2*  HCT 39.2 32.6*    Assessment/Plan: Doing well postpartum Breastfeeding and formula feeding. Is s/p Lactation consult. Circumcision after discharge with Pediatrician Contraception undecided Patient ok to discharge home   LOS: 1 day   Hildred Laser, MD [facility] 02/28/2020 1:50 PM

## 2020-02-28 NOTE — Anesthesia Postprocedure Evaluation (Signed)
Anesthesia Post Note  Patient: Felicia Curtis  Procedure(s) Performed: AN AD HOC LABOR EPIDURAL  Patient location during evaluation: Mother Baby Anesthesia Type: Epidural Level of consciousness: awake and alert Pain management: pain level controlled Vital Signs Assessment: post-procedure vital signs reviewed and stable Respiratory status: spontaneous breathing, nonlabored ventilation and respiratory function stable Cardiovascular status: stable Postop Assessment: no headache, no backache and epidural receding Anesthetic complications: no   No complications documented.   Last Vitals:  Vitals:   02/28/20 0338 02/28/20 0803  BP: (!) 100/54 105/61  Pulse: 92 75  Resp: 18 18  Temp: 36.8 C 36.5 C  SpO2: 95% 98%    Last Pain:  Vitals:   02/28/20 0854  TempSrc:   PainSc: 0-No pain                 Haleemah Buckalew Joanette Gula

## 2020-02-28 NOTE — Progress Notes (Signed)
Discharge orders received from Provider. RN went over all discharge paperwork with the patient and patient verbalized understanding. RN answered all patient questions and pt discharged home via Wheelchair with husband.

## 2020-02-28 NOTE — Discharge Summary (Signed)
Postpartum Discharge Summary      Patient Name: Felicia Curtis DOB: [birth year] MRN: [id number]  Date of admission: 02/27/2020 Delivery date:02/27/2020  Delivering provider: Rubie Maid  Date of discharge: 02/28/2020  Admitting diagnosis: Labor and delivery indication for care or intervention [O75.9] Intrauterine pregnancy: [redacted]w[redacted]d    Secondary diagnosis:  Active Problems:   Indication for care in labor and delivery, antepartum   Labor and delivery indication for care or intervention   Postdates pregnancy  Additional problems: Gallstones in pregnancy   Discharge diagnosis: Term Pregnancy Delivered                                              Post partum procedures: None Augmentation: N/A Complications: None  Hospital course: Onset of Labor With Vaginal Delivery      24y.o. yo G1P1001 at 410w4das admitted in Active Labor on 02/27/2020. Patient had an uncomplicated labor course as follows:  Membrane Rupture Time/Date:  ,   Delivery Method:Vaginal, Spontaneous  Episiotomy: None  Lacerations:  Vaginal  Patient had an uncomplicated postpartum course.  She is ambulating, tolerating a regular diet, passing flatus, and urinating well. Patient is discharged home in stable condition on 03/02/20.  Newborn Data: Birth date:02/27/2020  Birth time:3:57 PM  Gender:Female  Living status:Living  Apgars:8 ,9  Weight:6 lb 15.1 oz (3.15 kg)   Magnesium Sulfate received: No BMZ received: No Rhophylac:No MMR:No T-DaP:Given prenatally Flu: Given prenatally Transfusion:No  Physical exam  Vitals:   02/27/20 2331 02/28/20 0338 02/28/20 0803 02/28/20 1100  BP: 113/84 (!) 100/54 105/61 111/71  Pulse: (!) 106 92 75 88  Resp: 18 18 18 18   Temp: 97.9 F (36.6 C) 98.2 F (36.8 C) 97.7 F (36.5 C) 98.9 F (37.2 C)  TempSrc: Oral Oral Oral Oral  SpO2: 99% 95% 98%   Weight:      Height:       General: alert, cooperative, and no distress Lochia: appropriate Uterine Fundus:  firm Incision: N/A DVT Evaluation: No evidence of DVT seen on physical exam. Negative Homan's sign. No cords or calf tenderness. No significant calf/ankle edema.   Labs: Lab Results  Component Value Date   WBC 19.2 (H) 02/28/2020   HGB 11.2 (L) 02/28/2020   HCT 32.6 (L) 02/28/2020   MCV 89.1 02/28/2020   PLT 178 02/28/2020    CMP Latest Ref Rng & Units 11/01/2019  Glucose 70 - 99 mg/dL 92  BUN 6 - 20 mg/dL 7  Creatinine 0.44 - 1.00 mg/dL 0.50  Sodium 135 - 145 mmol/L 137  Potassium 3.5 - 5.1 mmol/L 3.7  Chloride 98 - 111 mmol/L 102  CO2 22 - 32 mmol/L 23  Calcium 8.9 - 10.3 mg/dL 9.6  Total Protein 6.5 - 8.1 g/dL 7.2  Total Bilirubin 0.3 - 1.2 mg/dL 0.6  Alkaline Phos 38 - 126 U/L 78  AST 15 - 41 U/L 82(H)  ALT 0 - 44 U/L 40    Edinburgh Score: Edinburgh Postnatal Depression Scale Screening Tool 02/28/2020  I have been able to laugh and see the funny side of things. 0  I have looked forward with enjoyment to things. 0  I have blamed myself unnecessarily when things went wrong. 1  I have been anxious or worried for no good reason. 1  I have felt scared or panicky for no good  reason. 0  Things have been getting on top of me. 0  I have been so unhappy that I have had difficulty sleeping. 0  I have felt sad or miserable. 0  I have been so unhappy that I have been crying. 0  The thought of harming myself has occurred to me. 0  Edinburgh Postnatal Depression Scale Total 2      After visit meds:  Allergies as of 02/28/2020       Reactions   Sulfa Antibiotics Rash        Medication List     TAKE these medications    acetaminophen 500 MG tablet Commonly known as: TYLENOL Take 1,000 mg by mouth every 6 (six) hours as needed.   ibuprofen 800 MG tablet Commonly known as: ADVIL Take 1 tablet (800 mg total) by mouth every 8 (eight) hours as needed.   multivitamin-prenatal 27-0.8 MG Tabs tablet Take 1 tablet by mouth daily at 12 noon.         Discharge  home in stable condition Infant Feeding: Bottle and Breast Infant Disposition:home with mother Discharge instruction: per After Visit Summary and Postpartum booklet. Activity: Advance as tolerated. Pelvic rest for 6 weeks.  Diet: routine diet Anticipated Birth Control: Unsure Postpartum Appointment:6 weeks Additional Postpartum F/U:  None Future Appointments:No future appointments. Follow up Visit:  Follow-up Information     Rubie Maid, MD Follow up in 6 week(s).   Specialties: Obstetrics and Gynecology, Radiology Why: Postpartum visit Contact information: [address] [place] [id number] [phone]                     02/28/2020 Rubie Maid, MD [facility]

## 2020-02-28 NOTE — Lactation Note (Signed)
This note was copied from a baby's chart. Lactation Consultation Note  Patient Name: Felicia Curtis LOVFI'E Date: 02/28/2020 Reason for consult: Follow-up assessment;1st time breastfeeding;Term Age:24 hours  Lactation follow-up. Parents report changing and preferring formula/bottle feeding. Parents were not reassured that baby was getting anything while at the breast: baby remained fussy, pushing away, and mom pumped but did not get any expressed milk. DEBP was set-up overnight, mom stated that it was painful and she didn't feel like that was something she could continue to do, but may consider later. When asking further questions: pump suction level was not reviewed and level may have been too high. LC reviewed recommendation of starting on lowest level and increasing as tolerated. LC provided education about onset of milk production, stimulation and milk removal needed to build supply, and importance of starting process early instead of deciding later they would like to give breastmilk. Parents verbalize understanding and state they think they would just like to provide formula for their baby. LC provided guidance for paced bottle feeding, burping, newborn stomach size, feeding patterns, and output expectations. Encouraged to call out with any questions or for assistance today.  Maternal Data Formula Feeding for Exclusion: Yes Reason for exclusion: Mother's choice to formula and breast feed on admission Has patient been taught Hand Expression?: Yes Does the patient have breastfeeding experience prior to this delivery?: No  Feeding    LATCH Score                   Interventions Interventions: Breast feeding basics reviewed;DEBP  Lactation Tools Discussed/Used Pump Education:  (set-up overnight) Date initiated:: 02/27/20   Consult Status Consult Status: Complete Date: 02/28/20 Follow-up type: Call as needed    Danford Bad 02/28/2020, 10:14 AM

## 2020-04-03 ENCOUNTER — Ambulatory Visit (INDEPENDENT_AMBULATORY_CARE_PROVIDER_SITE_OTHER): Payer: Managed Care, Other (non HMO) | Admitting: Obstetrics and Gynecology

## 2020-04-03 ENCOUNTER — Other Ambulatory Visit: Payer: Self-pay

## 2020-04-03 ENCOUNTER — Encounter: Payer: Self-pay | Admitting: Obstetrics and Gynecology

## 2020-04-03 DIAGNOSIS — Z30019 Encounter for initial prescription of contraceptives, unspecified: Secondary | ICD-10-CM

## 2020-04-03 LAB — POCT URINE PREGNANCY: Preg Test, Ur: NEGATIVE

## 2020-04-03 MED ORDER — NORGESTIM-ETH ESTRAD TRIPHASIC 0.18/0.215/0.25 MG-25 MCG PO TABS: 1.0000 | ORAL_TABLET | Freq: Every day | ORAL | 11 refills | Status: DC

## 2020-04-03 NOTE — Progress Notes (Signed)
Pt present for postpartum care. Pt stated that she was doing well. Pt is currently not breastfeeding, has not had sex since the birth of the baby, pt would like OCP for birth control; UPT neg.

## 2020-04-03 NOTE — Progress Notes (Addendum)
   OBSTETRICS POSTPARTUM CLINIC PROGRESS NOTE  Subjective:     Felicia Curtis is a 25 y.o. G79P1001 female who presents for a postpartum visit. She is 5 weeks postpartum following a spontaneous vaginal delivery. I have fully reviewed the prenatal and intrapartum course. Pregnancy was complicated by gallstones. The delivery was at 40.4 gestational weeks.  Anesthesia: epidural. Postpartum course has been well. Baby's course has been well. Baby is feeding by bottle - Similac Advance. Bleeding: patient has not resumed menses, with No LMP recorded. Bowel function is normal. Bladder function is normal. Patient is not sexually active. Contraception method desired is OCP (estrogen/progesterone). Postpartum depression screening: negative (EPDS score is 2).  The following portions of the patient's history were reviewed and updated as appropriate: allergies, current medications, past family history, past medical history, past social history, past surgical history and problem list.  Review of Systems Pertinent items noted in HPI and remainder of comprehensive ROS otherwise negative.   Objective:    There were no vitals taken for this visit.  General:  alert and no distress   Breasts:  inspection negative, no nipple discharge or bleeding, no masses or nodularity palpable  Lungs: clear to auscultation bilaterally  Heart:  regular rate and rhythm, S1, S2 normal, no murmur, click, rub or gallop  Abdomen: soft, non-tender; bowel sounds normal; no masses,  no organomegaly.     Vulva:  normal  Vagina: normal vagina, no discharge, exudate, lesion, or erythema  Cervix:  no cervical motion tenderness and no lesions  Corpus: normal size, contour, position, consistency, mobility, non-tender  Adnexa:  normal adnexa and no mass, fullness, tenderness  Rectal Exam: Not performed.         Labs:  Lab Results  Component Value Date   HGB 11.2 (L) 02/28/2020     Assessment:   Routine postpartum exam.  History  of gallstones   Plan:   1. Contraception: OCP (estrogen/progesterone) 2. Can f/u with General Surgery if desired for further management of gallstones. Notes she hasn't had any further issues since pregnancy.  3. Follow up in: 6 weeks or as needed.   Hildred Laser, MD [facility]

## 2020-04-09 ENCOUNTER — Ambulatory Visit: Payer: Managed Care, Other (non HMO) | Admitting: Nurse Practitioner

## 2020-04-09 ENCOUNTER — Other Ambulatory Visit: Payer: Self-pay

## 2020-04-09 VITALS — BP 118/66 | HR 68 | Temp 98.4°F | Resp 17 | Ht 65.0 in | Wt 199.0 lb

## 2020-04-09 DIAGNOSIS — Z008 Encounter for other general examination: Secondary | ICD-10-CM | POA: Diagnosis not present

## 2020-04-09 DIAGNOSIS — Z0000 Encounter for general adult medical examination without abnormal findings: Secondary | ICD-10-CM | POA: Diagnosis not present

## 2020-04-09 NOTE — Patient Instructions (Addendum)
Health Maintenance, Female Adopting a healthy lifestyle and getting preventive care are important in promoting health and wellness. Ask your health care provider about:  The right schedule for you to have regular tests and exams.  Things you can do on your own to prevent diseases and keep yourself healthy. What should I know about diet, weight, and exercise? Eat a healthy diet  Eat a diet that includes plenty of vegetables, fruits, low-fat dairy products, and lean protein.  Do not eat a lot of foods that are high in solid fats, added sugars, or sodium.   Maintain a healthy weight Body mass index (BMI) is used to identify weight problems. It estimates body fat based on height and weight. Your health care provider can help determine your BMI and help you achieve or maintain a healthy weight. Get regular exercise Get regular exercise. This is one of the most important things you can do for your health. Most adults should:  Exercise for at least 150 minutes each week. The exercise should increase your heart rate and make you sweat (moderate-intensity exercise).  Do strengthening exercises at least twice a week. This is in addition to the moderate-intensity exercise.  Spend less time sitting. Even light physical activity can be beneficial. Watch cholesterol and blood lipids Have your blood tested for lipids and cholesterol at 25 years of age, then have this test every 5 years. Have your cholesterol levels checked more often if:  Your lipid or cholesterol levels are high.  You are older than 24 years of age.  You are at high risk for heart disease. What should I know about cancer screening? Depending on your health history and family history, you may need to have cancer screening at various ages. This may include screening for:  Breast cancer.  Cervical cancer.  Colorectal cancer.  Skin cancer.  Lung cancer. What should I know about heart disease, diabetes, and high blood  pressure? Blood pressure and heart disease  High blood pressure causes heart disease and increases the risk of stroke. This is more likely to develop in people who have high blood pressure readings, are of African descent, or are overweight.  Have your blood pressure checked: ? Every 3-5 years if you are 38-35 years of age. ? Every year if you are 19 years old or older. Diabetes Have regular diabetes screenings. This checks your fasting blood sugar level. Have the screening done:  Once every three years after age 64 if you are at a normal weight and have a low risk for diabetes.  More often and at a younger age if you are overweight or have a high risk for diabetes. What should I know about preventing infection? Hepatitis B If you have a higher risk for hepatitis B, you should be screened for this virus. Talk with your health care provider to find out if you are at risk for hepatitis B infection. Hepatitis C Testing is recommended for:  Everyone born from [birth year] through 1965.  Anyone with known risk factors for hepatitis C. Sexually transmitted infections (STIs)  Get screened for STIs, including gonorrhea and chlamydia, if: ? You are sexually active and are younger than 25 years of age. ? You are older than 25 years of age and your health care provider tells you that you are at risk for this type of infection. ? Your sexual activity has changed since you were last screened, and you are at increased risk for chlamydia or gonorrhea. Ask your health care  provider if you are at risk.  Ask your health care provider about whether you are at high risk for HIV. Your health care provider may recommend a prescription medicine to help prevent HIV infection. If you choose to take medicine to prevent HIV, you should first get tested for HIV. You should then be tested every 3 months for as long as you are taking the medicine. Pregnancy  If you are about to stop having your period (premenopausal) and  you may become pregnant, seek counseling before you get pregnant.  Take 400 to 800 micrograms (mcg) of folic acid every day if you become pregnant.  Ask for birth control (contraception) if you want to prevent pregnancy. Osteoporosis and menopause Osteoporosis is a disease in which the bones lose minerals and strength with aging. This can result in bone fractures. If you are 49 years old or older, or if you are at risk for osteoporosis and fractures, ask your health care provider if you should:  Be screened for bone loss.  Take a calcium or vitamin D supplement to lower your risk of fractures.  Be given hormone replacement therapy (HRT) to treat symptoms of menopause. Follow these instructions at home: Lifestyle  Do not use any products that contain nicotine or tobacco, such as cigarettes, e-cigarettes, and chewing tobacco. If you need help quitting, ask your health care provider.  Do not use street drugs.  Do not share needles.  Ask your health care provider for help if you need support or information about quitting drugs. Alcohol use  Do not drink alcohol if: ? Your health care provider tells you not to drink. ? You are pregnant, may be pregnant, or are planning to become pregnant.  If you drink alcohol: ? Limit how much you use to 0-1 drink a day. ? Limit intake if you are breastfeeding.  Be aware of how much alcohol is in your drink. In the [place] one drink equals one 12 oz bottle of beer (355 mL), one 5 oz glass of wine (148 mL), or one 1 oz glass of hard liquor (44 mL). General instructions  Schedule regular health, dental, and eye exams.  Stay current with your vaccines.  Tell your health care provider if: ? You often feel depressed. ? You have ever been abused or do not feel safe at home. Summary  Adopting a healthy lifestyle and getting preventive care are important in promoting health and wellness.  Follow your health care provider's instructions about healthy  diet, exercising, and getting tested or screened for diseases.  Follow your health care provider's instructions on monitoring your cholesterol and blood pressure. This information is not intended to replace advice given to you by your health care provider. Make sure you discuss any questions you have with your health care provider. Document Revised: 02/08/2018 Document Reviewed: 02/08/2018 Elsevier Patient Education  2021 [employer]  High Triglycerides Eating Plan Triglycerides are a type of fat in the blood. High levels of triglycerides can increase your risk of heart disease and stroke. If your triglyceride levels are high, choosing the right foods can help lower your triglycerides and keep your heart healthy. Work with your health care provider or a diet and nutrition specialist (dietitian) to develop an eating plan that is right for you. What are tips for following this plan? General guidelines  Lose weight, if you are overweight. For most people, losing 5-10 lbs (2-5 kg) helps lower triglyceride levels. A weight-loss plan may include. ? 30 minutes of  exercise at least 5 days a week. ? Reducing the amount of calories, sugar, and fat you eat.  Eat a wide variety of fresh fruits, vegetables, and whole grains. These foods are high in fiber.  Eat foods that contain healthy fats, such as fatty fish, nuts, seeds, and olive oil.  Avoid foods that are high in added sugar, added salt (sodium), saturated fat, and trans fat.  Avoid low-fiber, refined carbohydrates such as white bread, crackers, noodles, and white rice.  Avoid foods with partially hydrogenated oils (trans fats), such as fried foods or stick margarine.  Limit alcohol intake to no more than 1 drink a day for nonpregnant women and 2 drinks a day for men. One drink equals 12 oz of beer, 5 oz of wine, or 1 oz of hard liquor. Your health care provider may recommend that you drink less depending on your overall health.   Reading  food labels  Check food labels for the amount of saturated fat. Choose foods with no or very little saturated fat.  Check food labels for the amount of trans fat. Choose foods with no trans fat.  Check food labels for the amount of cholesterol. Choose foods low in cholesterol. Ask your dietitian how much cholesterol you should have each day.  Check food labels for the amount of sodium. Choose foods with less than 140 milligrams (mg) per serving. Shopping  Buy dairy products labeled as nonfat (skim) or low-fat (1%).  Avoid buying processed or prepackaged foods. These are often high in added sugar, sodium, and fat. Cooking  Choose healthy fats when cooking, such as olive oil or canola oil.  Cook foods using lower fat methods, such as baking, broiling, boiling, or grilling.  Make your own sauces, dressings, and marinades when possible, instead of buying them. Store-bought sauces, dressings, and marinades are often high in sodium and sugar. Meal planning  Eat more home-cooked food and less restaurant, buffet, and fast food.  Eat fatty fish at least 2 times each week. Examples of fatty fish include salmon, trout, mackerel, tuna, and herring.  If you eat whole eggs, do not eat more than 3 egg yolks per week. What foods are recommended? The items listed may not be a complete list. Talk with your dietitian about what dietary choices are best for you. Grains Whole wheat or whole grain breads, crackers, cereals, and pasta. Unsweetened oatmeal. Bulgur. Barley. Quinoa. Brown rice. Whole wheat flour tortillas. Vegetables Fresh or frozen vegetables. Low-sodium canned vegetables. Fruits All fresh, canned (in natural juice), or frozen fruits. Meats and other protein foods Skinless chicken or [place]. Ground chicken or [place]. Lean cuts of pork, trimmed of fat. Fish and seafood, especially salmon, trout, and herring. Egg whites. Dried beans, peas, or lentils. Unsalted nuts or seeds. Unsalted  canned beans. Natural peanut or almond butter. Dairy Low-fat dairy products. Skim or low-fat (1%) milk. Reduced fat (2%) and low-sodium cheese. Low-fat ricotta cheese. Low-fat cottage cheese. Plain, low-fat yogurt. Fats and oils Tub margarine without trans fats. Light or reduced-fat mayonnaise. Light or reduced-fat salad dressings. Avocado. Safflower, olive, sunflower, soybean, and canola oils. What foods are not recommended? The items listed may not be a complete list. Talk with your dietitian about what dietary choices are best for you. Grains White bread. White (regular) pasta. White rice. Cornbread. Bagels. Pastries. Crackers that contain trans fat. Vegetables Creamed or fried vegetables. Vegetables in a cheese sauce. Fruits Sweetened dried fruit. Canned fruit in syrup. Fruit juice. Meats and other protein  foods Fatty cuts of meat. Ribs. Chicken wings. Tomasa Blase. Sausage. Bologna. Salami. Chitterlings. Fatback. Hot dogs. Bratwurst. Packaged lunch meats. Dairy Whole or reduced-fat (2%) milk. Half-and-half. Cream cheese. Full-fat or sweetened yogurt. Full-fat cheese. Nondairy creamers. Whipped toppings. Processed cheese or cheese spreads. Cheese curds. Beverages Alcohol. Sweetened drinks, such as soda, lemonade, fruit drinks, or punches. Fats and oils Butter. Stick margarine. Lard. Shortening. Ghee. Bacon fat. Tropical oils, such as coconut, palm kernel, or palm oils. Sweets and desserts Corn syrup. Sugars. Honey. Molasses. Candy. Jam and jelly. Syrup. Sweetened cereals. Cookies. Pies. Cakes. Donuts. Muffins. Ice cream. Condiments Store-bought sauces, dressings, and marinades that are high in sugar, such as ketchup and barbecue sauce. Summary  High levels of triglycerides can increase the risk of heart disease and stroke. Choosing the right foods can help lower your triglycerides.  Eat plenty of fresh fruits, vegetables, and whole grains. Choose low-fat dairy and lean meats. Eat fatty fish  at least twice a week.  Avoid processed and prepackaged foods with added sugar, sodium, saturated fat, and trans fat.  If you need suggestions or have questions about what types of food are good for you, talk with your health care provider or a dietitian. This information is not intended to replace advice given to you by your health care provider. Make sure you discuss any questions you have with your health care provider. Document Revised: 06/20/2019 Document Reviewed: 06/20/2019 Elsevier Patient Education  2021 [employer].

## 2020-04-09 NOTE — Progress Notes (Addendum)
Subjective:    Patient ID: Felicia Curtis, female    DOB: 1995/11/16, 25 y.o.   MRN: [id number]  HPI Felicia Curtis is a 25 y.o. female who presents to the [facility] for her yearly biometric screening exam. She reports she is doing well. Change in medical hx includes SVD of a baby boy 6 weeks ago. She states that during the pregnancy she had gallbladder issues with severe pain but was able to withstand the pain and did not have surgery. She reports that she and the baby are doing well and that her husband helps a lot.  When she returns to work, she will have child care provided by her mother who will come and stay every day.   Past Medical History:  Diagnosis Date  . Acute appendicitis 09/20/2013  . Gallstones 11/07/2019  . SVD (spontaneous vaginal delivery) 2022   Past Surgical History:  Procedure Laterality Date  . APPENDECTOMY  08/2013   SH: denies use of tobacco, ETOH or drugs  Diet/Exercise: Patient reports healthy diet that she started while she was pregnant. Her exercise at this time consist of getting up and down with the newborn.   Current Outpatient Medications on File Prior to Visit  Medication Sig Dispense Refill  . acetaminophen (TYLENOL) 500 MG tablet Take 1,000 mg by mouth every 6 (six) hours as needed. (Patient not taking: Reported on 04/09/2020)    . Norgestimate-Ethinyl Estradiol Triphasic 0.18/0.215/0.25 MG-25 MCG tab Take 1 tablet by mouth daily. 28 tablet 11  . Prenatal Vit-Fe Fumarate-FA (MULTIVITAMIN-PRENATAL) 27-0.8 MG TABS tablet Take 1 tablet by mouth daily at 12 noon. (Patient not taking: Reported on 04/09/2020)     No current facility-administered medications on file prior to visit.   Allergies  Allergen Reactions  . Sulfa Antibiotics Rash   Imm: She did not receive the Covid vaccine during pregnancy and has not had her influenza vaccine.   Review of Systems  Constitutional: Negative for chills and fever.  HENT: Negative.   Eyes: Negative for discharge,  redness and visual disturbance.  Respiratory: Negative for cough and shortness of breath.   Cardiovascular: Negative for chest pain and leg swelling.  Gastrointestinal: Negative for abdominal pain, nausea and vomiting.  Genitourinary: Negative for dysuria, frequency and pelvic pain.  Musculoskeletal: Negative for back pain, neck pain and neck stiffness.  Skin: Negative for rash.  Neurological: Negative for syncope and headaches.  Hematological: Negative for adenopathy.  Psychiatric/Behavioral: Negative for behavioral problems.       Patient reports no PP depression       Objective:   Physical Exam Vitals and nursing note reviewed.  Constitutional:      Appearance: Normal appearance.  HENT:     Head: Normocephalic and atraumatic.     Right Ear: Tympanic membrane, ear canal and external ear normal.     Left Ear: Tympanic membrane, ear canal and external ear normal.     Nose: Nose normal.     Mouth/Throat:     Mouth: Mucous membranes are moist.     Pharynx: Oropharynx is clear.  Eyes:     General: No visual field deficit.    Extraocular Movements: Extraocular movements intact.     Conjunctiva/sclera: Conjunctivae normal.     Pupils: Pupils are equal, round, and reactive to light.  Neck:     Thyroid: No thyroid mass or thyroid tenderness.     Vascular: Normal carotid pulses. No carotid bruit or JVD.     Trachea:  Trachea normal.  Cardiovascular:     Rate and Rhythm: Regular rhythm. Tachycardia present.     Pulses:          Carotid pulses are 2+ on the right side and 2+ on the left side.      Radial pulses are 2+ on the right side and 2+ on the left side.       Dorsalis pedis pulses are 2+ on the right side and 2+ on the left side.     Heart sounds: No murmur heard.   Pulmonary:     Effort: Pulmonary effort is normal.     Breath sounds: Normal breath sounds and air entry.  Abdominal:     General: Bowel sounds are normal.     Palpations: Abdomen is soft.     Tenderness:  There is no abdominal tenderness. There is no right CVA tenderness or left CVA tenderness.     Hernia: No hernia is present.  Genitourinary:    Comments: S/p SVD, no GU exam done today Musculoskeletal:     Cervical back: Normal range of motion. No pain with movement, spinous process tenderness or muscular tenderness.     Right lower leg: No edema.     Left lower leg: No edema.  Lymphadenopathy:     Cervical: No cervical adenopathy.  Skin:    General: Skin is warm and dry.  Neurological:     Mental Status: She is alert.     Cranial Nerves: No facial asymmetry.     Sensory: Sensation is intact.     Motor: No weakness or pronator drift.     Coordination: Romberg sign negative. Finger-Nose-Finger Test normal.     Gait: Gait is intact.     Deep Tendon Reflexes:     Reflex Scores:      Bicep reflexes are 2+ on the right side and 2+ on the left side.      Brachioradialis reflexes are 2+ on the right side and 2+ on the left side.      Patellar reflexes are 2+ on the right side and 2+ on the left side.    Comments: Ambulatory with steady gait, stands on one foot without difficulty, straight leg raises without pain, no calf tenderness, no lower extremity edema. Grips are equal, radial pulses 2+.  Psychiatric:        Attention and Perception: Attention and perception normal.        Mood and Affect: Mood normal.        Speech: Speech normal.        Behavior: Behavior normal.        Thought Content: Thought content normal.        Assessment & Plan:  1. Encounter for other general examination 2. Encounter for biometric screening Lipid panel Glucose 3. Encounter for preventive health examination  Discussed with the patient immunizations, diet and exercise. Discussed returning to work s/p maternity leave and support system at home. Patient given opportunity to ask questions. All questioned fully answered and patient voices understanding and agrees with plan. She will return in one year or  sooner for any problems.  03/11/20 Results of lab reviewed and noted that triglycerides are elevated. Note sent to patient and instructions in "My Chart".

## 2020-04-10 LAB — LIPID PANEL
Chol/HDL Ratio: 4.2 ratio (ref 0.0–4.4)
Cholesterol, Total: 192 mg/dL (ref 100–199)
HDL: 46 mg/dL (ref >39)
LDL Chol Calc (NIH): 106 mg/dL — ABNORMAL HIGH (ref 0–99)
Triglycerides: 233 mg/dL — ABNORMAL HIGH (ref 0–149)
VLDL Cholesterol Cal: 40 mg/dL (ref 5–40)

## 2020-04-10 LAB — GLUCOSE, RANDOM: Glucose: 88 mg/dL (ref 65–99)

## 2020-04-11 ENCOUNTER — Encounter: Payer: Self-pay | Admitting: Nurse Practitioner

## 2020-09-25 ENCOUNTER — Encounter: Payer: Managed Care, Other (non HMO) | Admitting: Obstetrics and Gynecology

## 2020-09-25 ENCOUNTER — Encounter: Payer: Self-pay | Admitting: Obstetrics and Gynecology

## 2020-09-25 ENCOUNTER — Other Ambulatory Visit (HOSPITAL_COMMUNITY)
Admission: RE | Admit: 2020-09-25 | Discharge: 2020-09-25 | Disposition: A | Discharge status: Home / Self Care | Payer: Managed Care, Other (non HMO) | Source: Ambulatory Visit | Attending: Obstetrics and Gynecology | Admitting: Obstetrics and Gynecology

## 2020-09-25 ENCOUNTER — Ambulatory Visit (INDEPENDENT_AMBULATORY_CARE_PROVIDER_SITE_OTHER): Payer: Managed Care, Other (non HMO) | Admitting: Obstetrics and Gynecology

## 2020-09-25 ENCOUNTER — Other Ambulatory Visit: Payer: Self-pay

## 2020-09-25 VITALS — BP 115/80 | HR 72 | Resp 16 | Ht 67.0 in | Wt 191.7 lb

## 2020-09-25 DIAGNOSIS — Z01419 Encounter for gynecological examination (general) (routine) without abnormal findings: Secondary | ICD-10-CM | POA: Diagnosis not present

## 2020-09-25 DIAGNOSIS — Z124 Encounter for screening for malignant neoplasm of cervix: Secondary | ICD-10-CM

## 2020-09-25 NOTE — Patient Instructions (Signed)
Breast Self-Awareness Breast self-awareness is knowing how your breasts look and feel. Doing breast self-awareness is important. It allows you to catch a breast problem early while it is still small and can be treated. All women should do breast self-awareness, including women who have had breast implants. Tell your doctorif you notice a change in your breasts. What you need: A mirror. A well-lit room. How to do a breast self-exam A breast self-exam is one way to learn what is normal for your breasts and tocheck for changes. To do a breast self-exam: Look for changes  Take off all the clothes above your waist. Stand in front of a mirror in a room with good lighting. Put your hands on your hips. Push your hands down. Look at your breasts and nipples in the mirror to see if one breast or nipple looks different from the other. Check to see if: The shape of one breast is different. The size of one breast is different. There are wrinkles, dips, and bumps in one breast and not the other. Look at each breast for changes in the skin, such as: Redness. Scaly areas. Look for changes in your nipples, such as: Liquid around the nipples. Bleeding. Dimpling. Redness. A change in where the nipples are.  Feel for changes  Lie on your back on the floor. Feel each breast. To do this, follow these steps: Pick a breast to feel. Put the arm closest to that breast above your head. Use your other arm to feel the nipple area of your breast. Feel the area with the pads of your three middle fingers by making small circles with your fingers. For the first circle, press lightly. For the second circle, press harder. For the third circle, press even harder. Keep making circles with your fingers at the different pressures as you move down your breast. Stop when you feel your ribs. Move your fingers a little toward the center of your body. Start making circles with your fingers again, this time going up until  you reach your collarbone. Keep making up-and-down circles until you reach your armpit. Remember to keep using the three pressures. Feel the other breast in the same way. Sit or stand in the tub or shower. With soapy water on your skin, feel each breast the same way you did in step 2 when you were lying on the floor.  Write down what you find Writing down what you find can help you remember what to tell your doctor. Write down: What is normal for each breast. Any changes you find in each breast, including: The kind of changes you find. Whether you have pain. Size and location of any lumps. When you last had your menstrual period. General tips Check your breasts every month. If you are breastfeeding, the best time to check your breasts is after you feed your baby or after you use a breast pump. If you get menstrual periods, the best time to check your breasts is 5-7 days after your menstrual period is over. With time, you will become comfortable with the self-exam, and you will begin to know if there are changes in your breasts. Contact a doctor if you: See a change in the shape or size of your breasts or nipples. See a change in the skin of your breast or nipples, such as red or scaly skin. Have fluid coming from your nipples that is not normal. Find a lump or thick area that was not there before. Have pain in   your breasts. Have any concerns about your breast health. Summary Breast self-awareness includes looking for changes in your breasts, as well as feeling for changes within your breasts. Breast self-awareness should be done in front of a mirror in a well-lit room. You should check your breasts every month. If you get menstrual periods, the best time to check your breasts is 5-7 days after your menstrual period is over. Let your doctor know of any changes you see in your breasts, including changes in size, changes on the skin, pain or tenderness, or fluid from your nipples that is  not normal. This information is not intended to replace advice given to you by your health care provider. Make sure you discuss any questions you have with your healthcare provider. Document Revised: 10/04/2017 Document Reviewed: 10/04/2017 Elsevier Patient Education  2022 Else[employer] Preventive Care 21-39 Years Old, Female Preventive care refers to lifestyle choices and visits with your health care provider that can promote health and wellness. This includes: A yearly physical exam. This is also called an annual wellness visit. Regular dental and eye exams. Immunizations. Screening for certain conditions. Healthy lifestyle choices, such as: Eating a healthy diet. Getting regular exercise. Not using drugs or products that contain nicotine and tobacco. Limiting alcohol use. What can I expect for my preventive care visit? Physical exam Your health care provider may check your: Height and weight. These may be used to calculate your BMI (body mass index). BMI is a measurement that tells if you are at a healthy weight. Heart rate and blood pressure. Body temperature. Skin for abnormal spots. Counseling Your health care provider may ask you questions about your: Past medical problems. Family's medical history. Alcohol, tobacco, and drug use. Emotional well-being. Home life and relationship well-being. Sexual activity. Diet, exercise, and sleep habits. Work and work environment. Access to firearms. Method of birth control. Menstrual cycle. Pregnancy history. What immunizations do I need?  Vaccines are usually given at various ages, according to a schedule. Your health care provider will recommend vaccines for you based on your age, medicalhistory, and lifestyle or other factors, such as travel or where you work. What tests do I need?  Blood tests Lipid and cholesterol levels. These may be checked every 5 years starting at age 20. Hepatitis C test. Hepatitis B  test. Screening Diabetes screening. This is done by checking your blood sugar (glucose) after you have not eaten for a while (fasting). STD (sexually transmitted disease) testing, if you are at risk. BRCA-related cancer screening. This may be done if you have a family history of breast, ovarian, tubal, or peritoneal cancers. Pelvic exam and Pap test. This may be done every 3 years starting at age 21. Starting at age 30, this may be done every 5 years if you have a Pap test in combination with an HPV test. Talk with your health care provider about your test results, treatment options,and if necessary, the need for more tests. Follow these instructions at home: Eating and drinking  Eat a healthy diet that includes fresh fruits and vegetables, whole grains, lean protein, and low-fat dairy products. Take vitamin and mineral supplements as recommended by your health care provider. Do not drink alcohol if: Your health care provider tells you not to drink. You are pregnant, may be pregnant, or are planning to become pregnant. If you drink alcohol: Limit how much you have to 0-1 drink a day. Be aware of how much alcohol is in your drink. In the [place] one   drink equals one 12 oz bottle of beer (355 mL), one 5 oz glass of wine (148 mL), or one 1 oz glass of hard liquor (44 mL).  Lifestyle Take daily care of your teeth and gums. Brush your teeth every morning and night with fluoride toothpaste. Floss one time each day. Stay active. Exercise for at least 30 minutes 5 or more days each week. Do not use any products that contain nicotine or tobacco, such as cigarettes, e-cigarettes, and chewing tobacco. If you need help quitting, ask your health care provider. Do not use drugs. If you are sexually active, practice safe sex. Use a condom or other form of protection to prevent STIs (sexually transmitted infections). If you do not wish to become pregnant, use a form of birth control. If you plan to become  pregnant, see your health care provider for a prepregnancy visit. Find healthy ways to cope with stress, such as: Meditation, yoga, or listening to music. Journaling. Talking to a trusted person. Spending time with friends and family. Safety Always wear your seat belt while driving or riding in a vehicle. Do not drive: If you have been drinking alcohol. Do not ride with someone who has been drinking. When you are tired or distracted. While texting. Wear a helmet and other protective equipment during sports activities. If you have firearms in your house, make sure you follow all gun safety procedures. Seek help if you have been physically or sexually abused. What's next? Go to your health care provider once a year for an annual wellness visit. Ask your health care provider how often you should have your eyes and teeth checked. Stay up to date on all vaccines. This information is not intended to replace advice given to you by your health care provider. Make sure you discuss any questions you have with your healthcare provider. Document Revised: 10/14/2019 Document Reviewed: 10/27/2017 Elsevier Patient Education  2022 Elsevier Inc.  

## 2020-09-25 NOTE — Progress Notes (Signed)
11   GYNECOLOGY ANNUAL PHYSICAL EXAM PROGRESS NOTE  Subjective:    Felicia Curtis is a 25 y.o. G60P1001 female who presents for an annual exam. The patient has no complaints today. The patient is sexually active. The patient participates in regular exercise: no. Has the patient ever been transfused or tattooed?: yes. The patient reports that there is not domestic violence in her life.   Menstrual History: Menarche age: 83 Patient's last menstrual period was 08/28/2020. Period Cycle (Days): 30 Period Duration (Days): 4-5 Period Pattern: Regular Menstrual Flow: Heavy Menstrual Control: Maxi pad Menstrual Control Change Freq (Hours): 2-3 Dysmenorrhea: (!) Severe Dysmenorrhea Symptoms: Cramping  Gynecologic History:  Contraception: none. Notes she is mainly abstinent for now. History of STI's: None Last Pap: None     Upstream - 09/25/20 1101       Pregnancy Intention Screening   Does the patient want to become pregnant in the next year? No    Does the patient's partner want to become pregnant in the next year? No    Would the patient like to discuss contraceptive options today? No      Contraception Wrap Up   Current Method No Contraceptive Precautions    Contraception Counseling Provided No            The pregnancy intention screening data noted above was reviewed. Potential methods of contraception were discussed. The patient elected to proceed with No Contraception Precautions.    OB History  Gravida Para Term Preterm AB Living  1 1 1  0 0 1  SAB IAB Ectopic Multiple Live Births  0 0 0 0 1    # Outcome Date GA Lbr Len/2nd Weight Sex Delivery Anes PTL Lv  1 Term 02/27/20 [redacted]w[redacted]d / 02:31 6 lb 15.1 oz (3.15 kg) M Vag-Spont EPI  LIV     Name: Fore,BOY Tessah     Apgar1: 8  Apgar5: 9    Past Medical History:  Diagnosis Date   Acute appendicitis 09/20/2013   Gallstones 11/07/2019   SVD (spontaneous vaginal delivery) 2022    Past Surgical History:  Procedure  Laterality Date   APPENDECTOMY  08/2013    Family History  Problem Relation Age of Onset   Healthy Mother    Healthy Father     Social History   Socioeconomic History   Marital status: Married    Spouse name: 09/2013   Number of children: Not on file   Years of education: Not on file   Highest education level: Not on file  Occupational History   Not on file  Tobacco Use   Smoking status: Never   Smokeless tobacco: Never  Vaping Use   Vaping Use: Never used  Substance and Sexual Activity   Alcohol use: No   Drug use: Never   Sexual activity: Not Currently    Comment: Undecided  Other Topics Concern   Not on file  Social History Narrative   Not on file   Social Determinants of Health   Financial Resource Strain: Not on file  Food Insecurity: Not on file  Transportation Needs: Not on file  Physical Activity: Not on file  Stress: Not on file  Social Connections: Not on file  Intimate Partner Violence: Not on file    Current Outpatient Medications on File Prior to Visit  Medication Sig Dispense Refill   PREVIDENT 5000 BOOSTER PLUS 1.1 % PSTE See admin instructions.     No current facility-administered medications on file prior to visit.  Allergies  Allergen Reactions   Sulfa Antibiotics Rash     Review of Systems Constitutional: negative for chills, fatigue, fevers and sweats Eyes: negative for irritation, redness and visual disturbance Ears, nose, mouth, throat, and face: negative for hearing loss, nasal congestion, snoring and tinnitus Respiratory: negative for asthma, cough, sputum Cardiovascular: negative for chest pain, dyspnea, exertional chest pressure/discomfort, irregular heart beat, palpitations and syncope Gastrointestinal: negative for abdominal pain, change in bowel habits, nausea and vomiting Genitourinary: negative for abnormal menstrual periods, genital lesions, sexual problems and vaginal discharge, dysuria and urinary  incontinence Integument/breast: negative for breast lump, breast tenderness and nipple discharge Hematologic/lymphatic: negative for bleeding and easy bruising Musculoskeletal:negative for back pain and muscle weakness Neurological: negative for dizziness, headaches, vertigo and weakness Endocrine: negative for diabetic symptoms including polydipsia, polyuria and skin dryness Allergic/Immunologic: negative for hay fever and urticaria      Objective:  not currently breastfeeding.  Vitals:   09/25/20 1102  BP: 115/80  Pulse: 72  Resp: 16  Height: 5\' 7"  (1.702 m)  Weight: 191 lb 11.2 oz (87 kg)  BMI (Calculated): 30.02     General Appearance:    Alert, cooperative, no distress, appears stated age  Head:    Normocephalic, without obvious abnormality, atraumatic  Eyes:    PERRL, conjunctiva/corneas clear, EOM's intact, both eyes  Ears:    Normal external ear canals, both ears  Nose:   Nares normal, septum midline, mucosa normal, no drainage or sinus tenderness  Throat:   Lips, mucosa, and tongue normal; teeth and gums normal  Neck:   Supple, symmetrical, trachea midline, no adenopathy; thyroid: no enlargement/tenderness/nodules; no carotid bruit or JVD  Back:     Symmetric, no curvature, ROM normal, no CVA tenderness  Lungs:     Clear to auscultation bilaterally, respirations unlabored  Chest Wall:    No tenderness or deformity   Heart:    Regular rate and rhythm, S1 and S2 normal, no murmur, rub or gallop  Breast Exam:    No tenderness, masses, or nipple abnormality  Abdomen:     Soft, non-tender, bowel sounds active all four quadrants, no masses, no organomegaly.    Genitalia:    Pelvic:external genitalia normal, vagina without lesions, discharge, or tenderness, rectovaginal septum  normal. Cervix normal in appearance, no cervical motion tenderness, no adnexal masses or tenderness.  Uterus normal size, shape, mobile, regular contours, nontender.  Rectal:    Normal external sphincter.   No hemorrhoids appreciated. Internal exam not done.   Extremities:   Extremities normal, atraumatic, no cyanosis or edema  Pulses:   2+ and symmetric all extremities  Skin:   Skin color, texture, turgor normal, no rashes or lesions  Lymph nodes:   Cervical, supraclavicular, and axillary nodes normal  Neurologic:   CNII-XII intact, normal strength, sensation and reflexes throughout   Labs:  Lab Results  Component Value Date   WBC 19.2 (H) 02/28/2020   HGB 11.2 (L) 02/28/2020   HCT 32.6 (L) 02/28/2020   MCV 89.1 02/28/2020   PLT 178 02/28/2020    Lab Results  Component Value Date   CREATININE 0.50 11/01/2019   BUN 7 11/01/2019   NA 137 11/01/2019   K 3.7 11/01/2019   CL 102 11/01/2019   CO2 23 11/01/2019    Lab Results  Component Value Date   ALT 40 11/01/2019   AST 82 (H) 11/01/2019   ALKPHOS 78 11/01/2019   BILITOT 0.6 11/01/2019    Lab Results  Component Value Date   TSH 1.740 07/20/2019     Assessment:   1. Well woman exam with routine gynecological exam   2. Cervical cancer screening      Plan:  Blood tests: Comprehensive metabolic panel, CBC Breast self exam technique reviewed and patient encouraged to perform self-exam monthly. Contraception: none. Discussed healthy lifestyle modifications. Mammogram  Not applicable Pap smear ordered. Declines STI screening (GC/Cl) based on age guidelines.  Discussed Hepatitis C screening recommendations, ok to do.  COVID vaccination status: None Follow up in 1 year for annual exam   Hildred Laser, MD [facility]

## 2020-09-26 ENCOUNTER — Encounter: Payer: Managed Care, Other (non HMO) | Admitting: Obstetrics and Gynecology

## 2020-09-26 LAB — COMPREHENSIVE METABOLIC PANEL
ALT: 11 IU/L (ref 0–32)
AST: 16 IU/L (ref 0–40)
Albumin/Globulin Ratio: 2.1 (ref 1.2–2.2)
Albumin: 4.8 g/dL (ref 3.9–5.0)
Alkaline Phosphatase: 84 IU/L (ref 44–121)
BUN/Creatinine Ratio: 15 (ref 9–23)
BUN: 10 mg/dL (ref 6–20)
Bilirubin Total: 0.4 mg/dL (ref 0.0–1.2)
CO2: 25 mmol/L (ref 20–29)
Calcium: 9.3 mg/dL (ref 8.7–10.2)
Chloride: 100 mmol/L (ref 96–106)
Creatinine, Ser: 0.66 mg/dL (ref 0.57–1.00)
Globulin, Total: 2.3 g/dL (ref 1.5–4.5)
Glucose: 72 mg/dL (ref 65–99)
Potassium: 4.2 mmol/L (ref 3.5–5.2)
Sodium: 141 mmol/L (ref 134–144)
Total Protein: 7.1 g/dL (ref 6.0–8.5)
eGFR: 125 mL/min/{1.73_m2} (ref >59)

## 2020-09-26 LAB — CBC
Hematocrit: 39.5 % (ref 34.0–46.6)
Hemoglobin: 13.4 g/dL (ref 11.1–15.9)
MCH: 30 pg (ref 26.6–33.0)
MCHC: 33.9 g/dL (ref 31.5–35.7)
MCV: 89 fL (ref 79–97)
Platelets: 259 10*3/uL (ref 150–450)
RBC: 4.46 x10E6/uL (ref 3.77–5.28)
RDW: 12.8 % (ref 11.7–15.4)
WBC: 8 10*3/uL (ref 3.4–10.8)

## 2020-09-26 LAB — CYTOLOGY - PAP: Diagnosis: NEGATIVE

## 2020-10-02 IMAGING — CR RIGHT FOOT COMPLETE - 3+ VIEW
1 series · 3 of 3 positions shown · non-contrast
Comparison: None.

CLINICAL DATA: Crushing injury, pain

EXAM:
RIGHT FOOT COMPLETE - 3+ VIEW

[Series 1: dg foot complete right · 0.14mm/px · 3 of 3 slices shown]
[im 1/3]
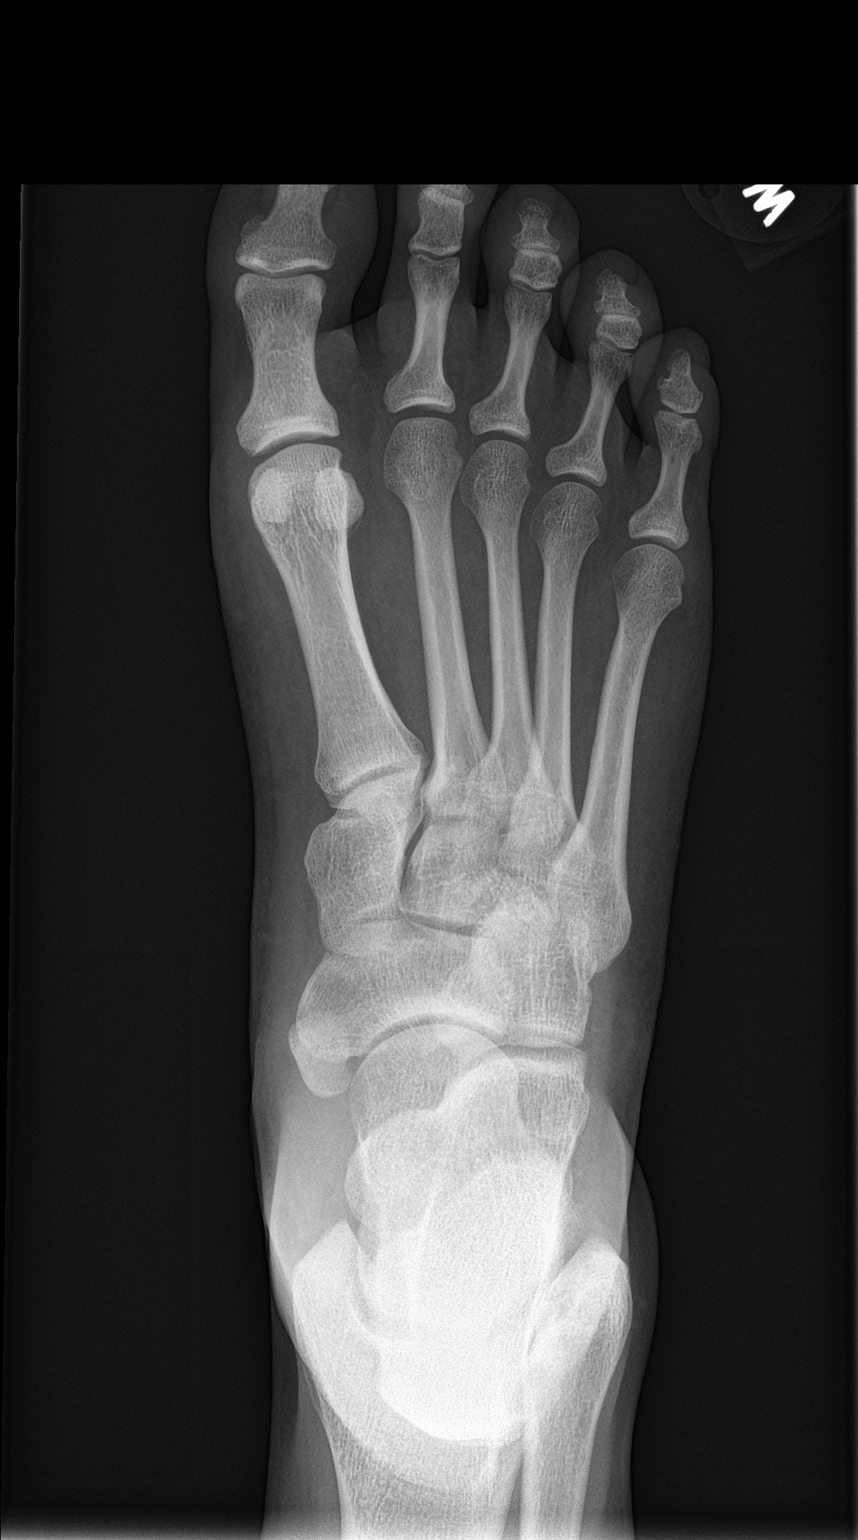
[im 2/3]
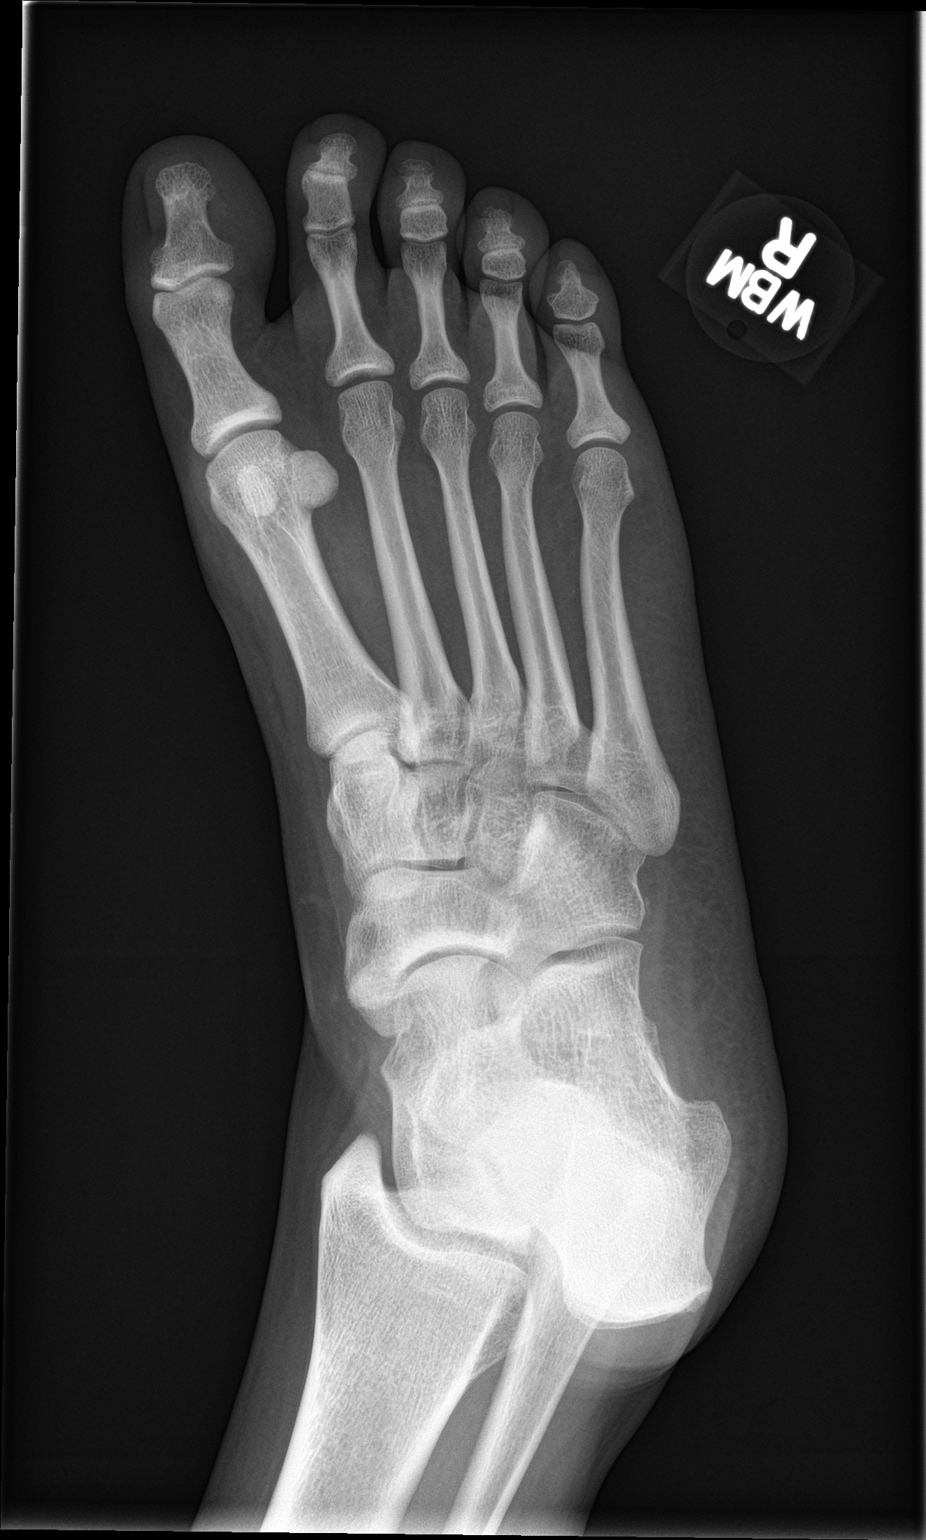
[im 3/3]
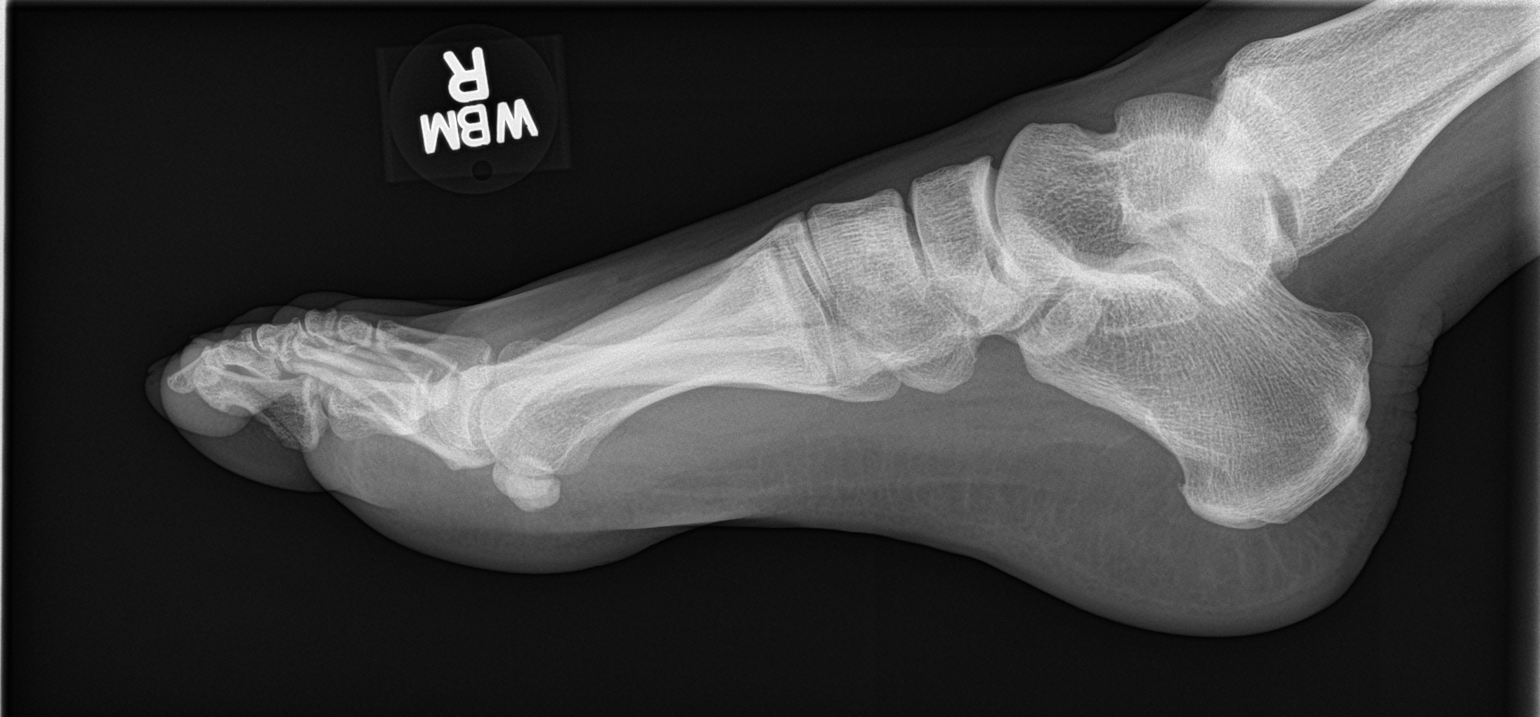

[3 of 3 positions shown; findings below may reference images not displayed]

FINDINGS: No fracture or dislocation of the right foot. Joint spaces are well
preserved. Mild soft tissue edema of the forefoot.
IMPRESSION: No fracture or dislocation of the right foot. Joint spaces are well
preserved. Mild soft tissue edema of the forefoot.

## 2021-02-19 IMAGING — US US PELVIS COMPLETE WITH TRANSVAGINAL
1 series · 14 of 25 positions shown · non-contrast
Comparison: None

CLINICAL DATA: Dysmenorrhea with pelvic pain

EXAM:
TRANSABDOMINAL AND TRANSVAGINAL ULTRASOUND OF PELVIS
TECHNIQUE: Both transabdominal and transvaginal ultrasound examinations of the
pelvis were performed. Transabdominal technique was performed for
global imaging of the pelvis including uterus, ovaries, adnexal
regions, and pelvic cul-de-sac. It was necessary to proceed with
endovaginal exam following the transabdominal exam to visualize the
uterus endometrium ovaries.

[Series 1: us pelvis complete with transvaginal · 14 of 100 slices shown]
[im 1/100]
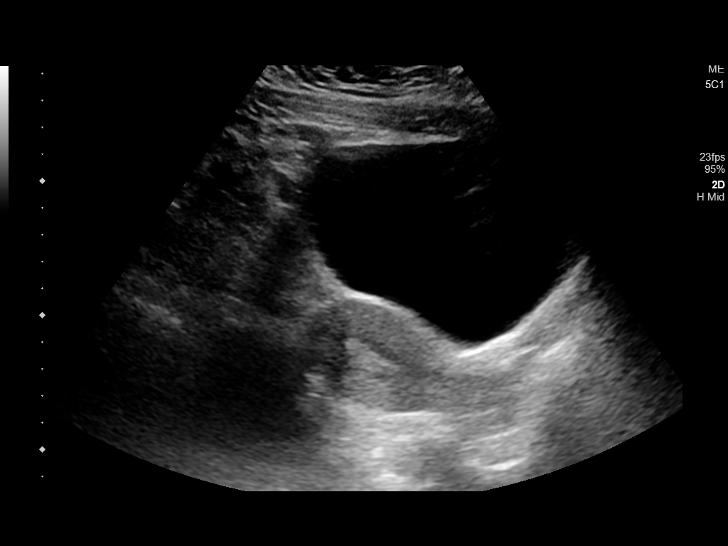
[im 9/100]
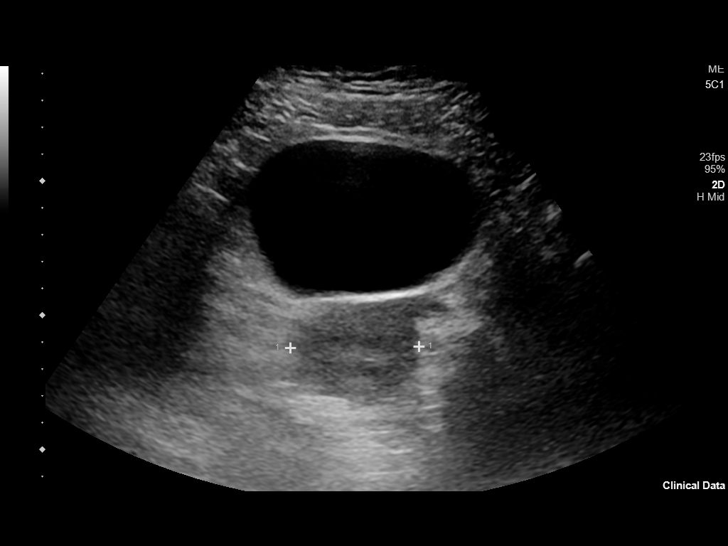
[im 17/100]
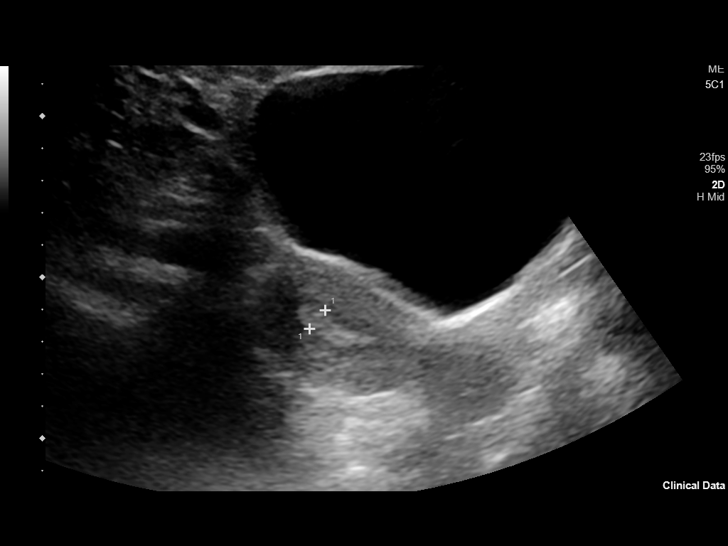
[im 25/100]
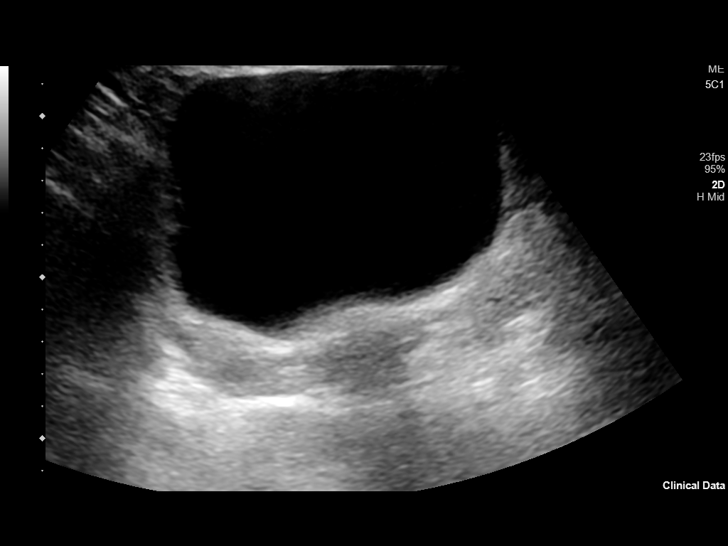
[im 34/100]
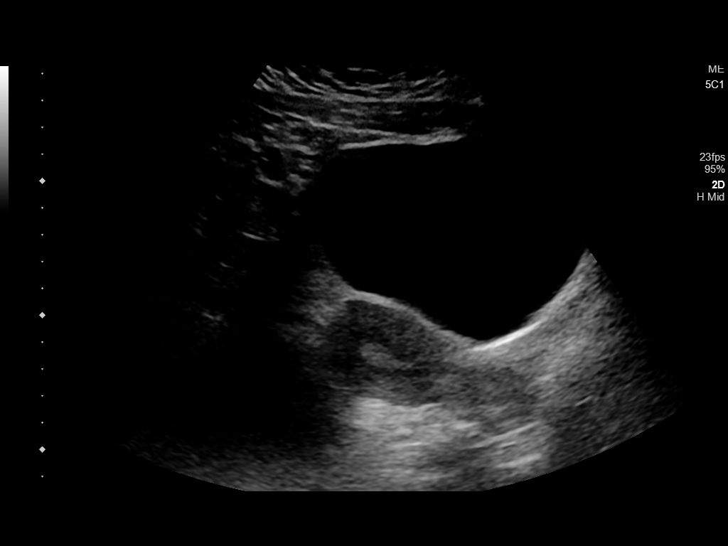
[im 38/100]
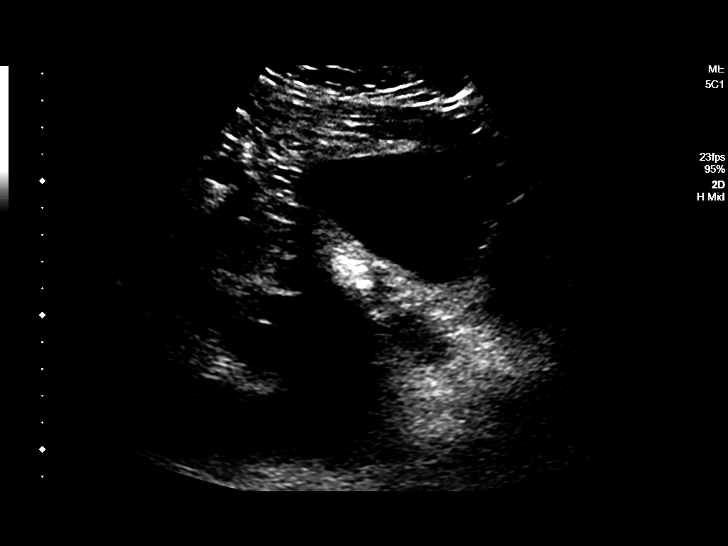
[im 46/100]
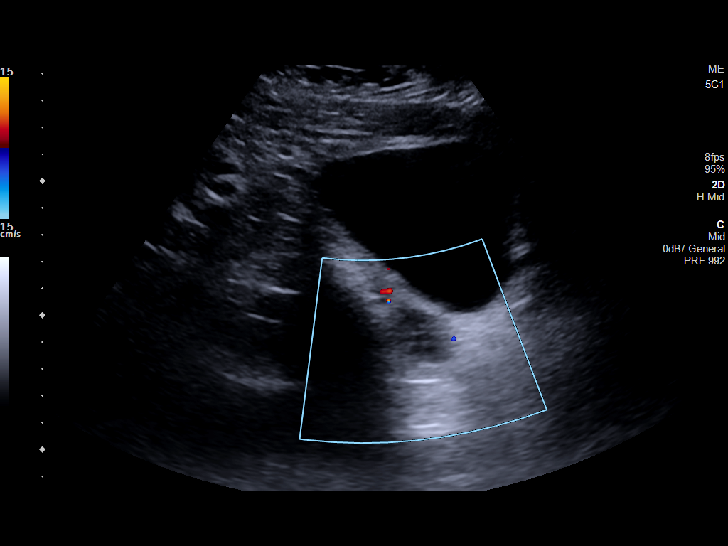
[im 54/100]
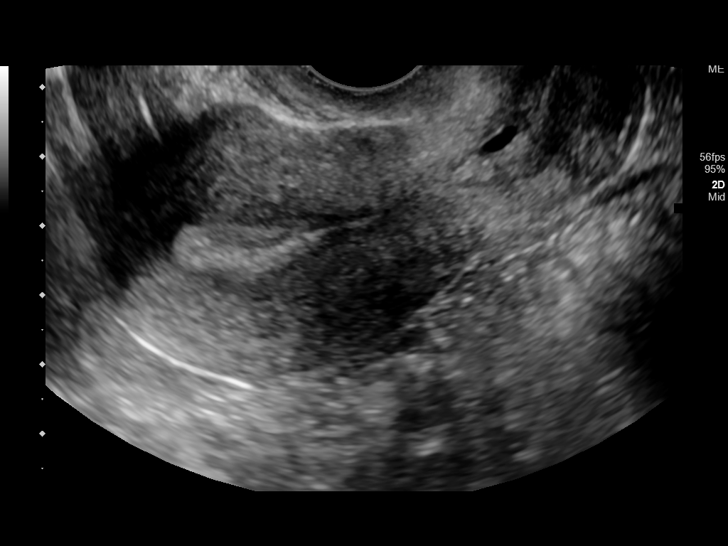
[im 62/100]
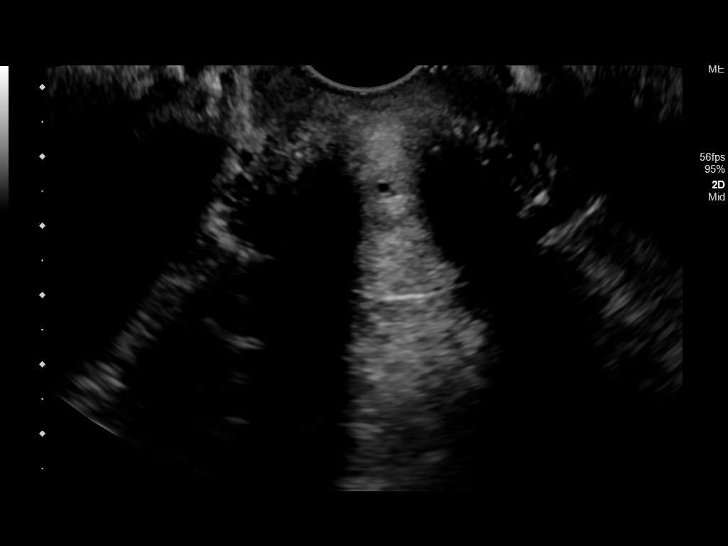
[im 67/100]
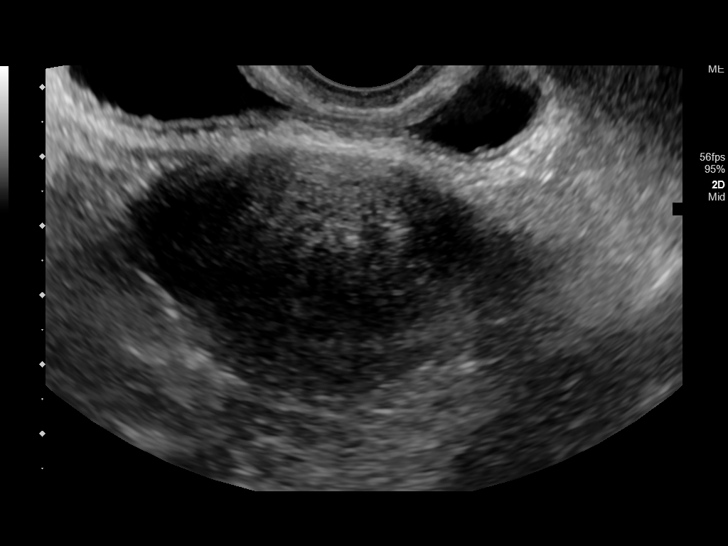
[im 75/100]
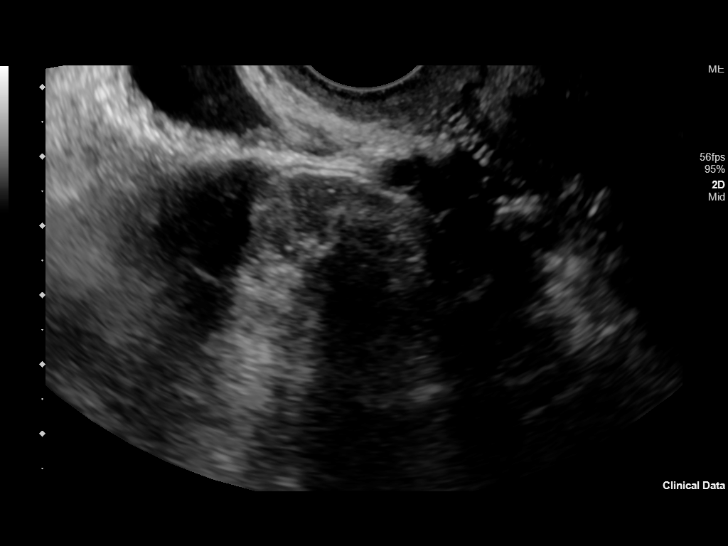
[im 83/100]
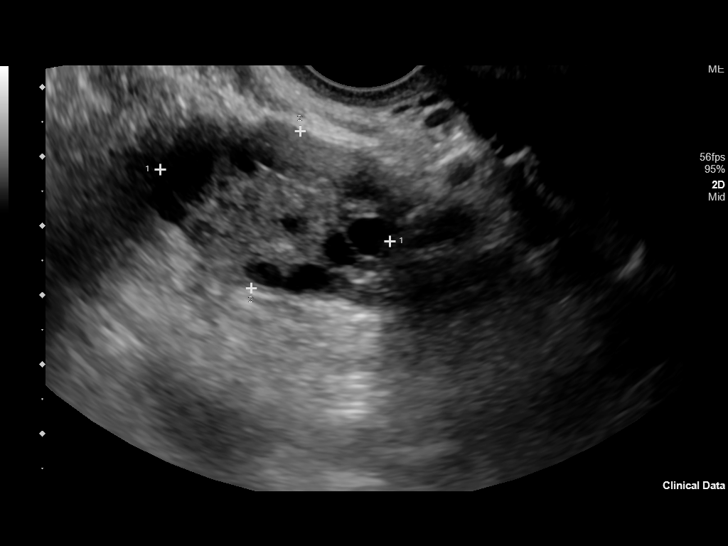
[im 91/100]
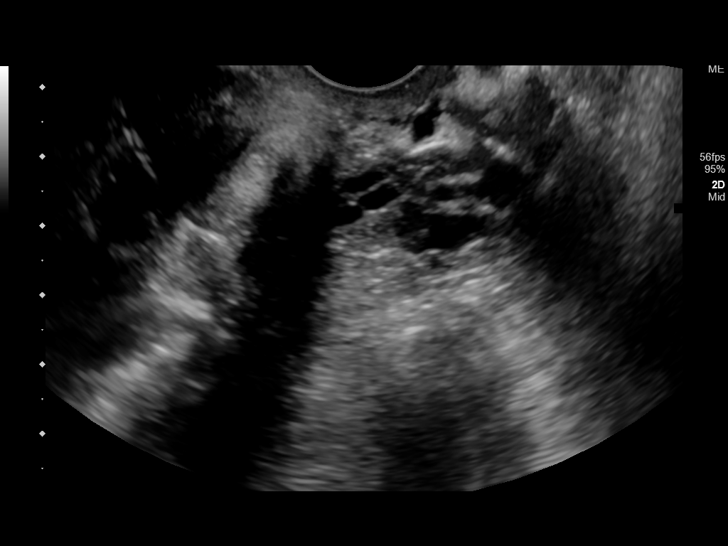
[im 100/100]
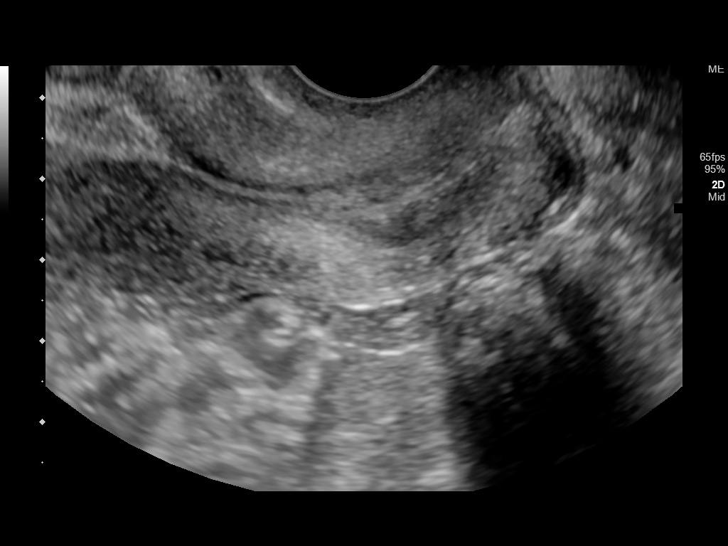

[14 of 25 positions shown; findings below may reference images not displayed]

FINDINGS: Uterus

Measurements: 7.2 x 4 x 4.8 cm = volume: 72.5 mL. No fibroids or
other mass visualized.

Endometrium

Thickness: 7.5 mm.  No focal abnormality visualized.

Right ovary

Measurements: 3.5 x 2.4 x 3 cm = volume: 13.1 mL. Normal
appearance/no adnexal mass.

Left ovary

Measurements: 2.6 x 1.7 x 2.2 cm = volume: 5.2 mL. Normal
appearance/no adnexal mass.

Other findings

No abnormal free fluid.
IMPRESSION: Negative pelvic ultrasound

## 2021-09-17 DIAGNOSIS — M25561 Pain in right knee: Secondary | ICD-10-CM | POA: Insufficient documentation

## 2021-09-17 DIAGNOSIS — M25469 Effusion, unspecified knee: Secondary | ICD-10-CM | POA: Insufficient documentation

## 2021-11-18 IMAGING — US US ABDOMEN LIMITED
1 series · 14 of 25 positions shown · non-contrast
Comparison: None.

CLINICAL DATA: Upper abdominal pain on and off for 6 weeks.
Twenty-four weeks pregnant patient.

EXAM:
ULTRASOUND ABDOMEN LIMITED RIGHT UPPER QUADRANT

[Series 1: us abdomen limited ruq · 14 of 29 slices shown]
[im 1/29]
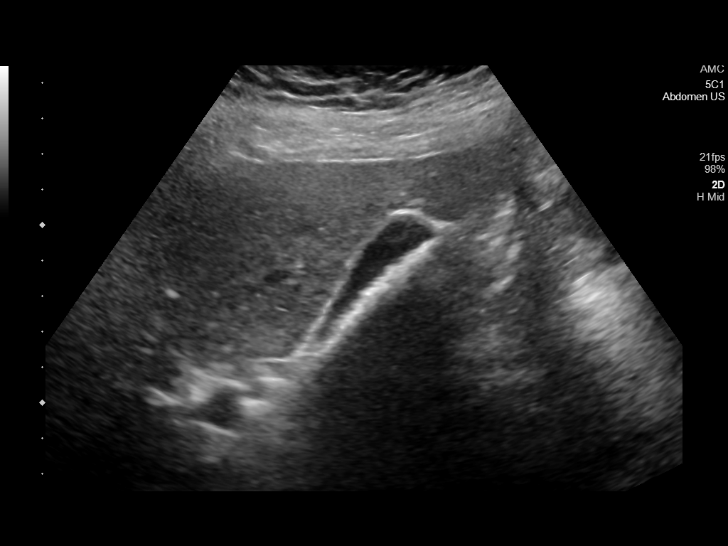
[im 3/29]
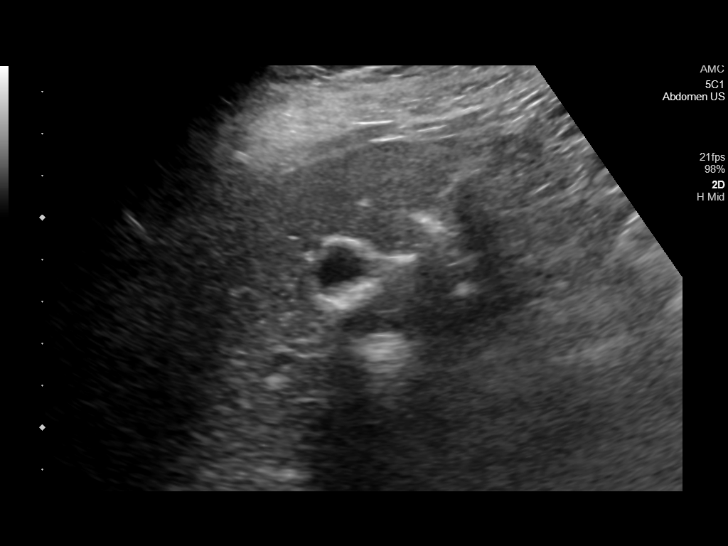
[im 5/29]
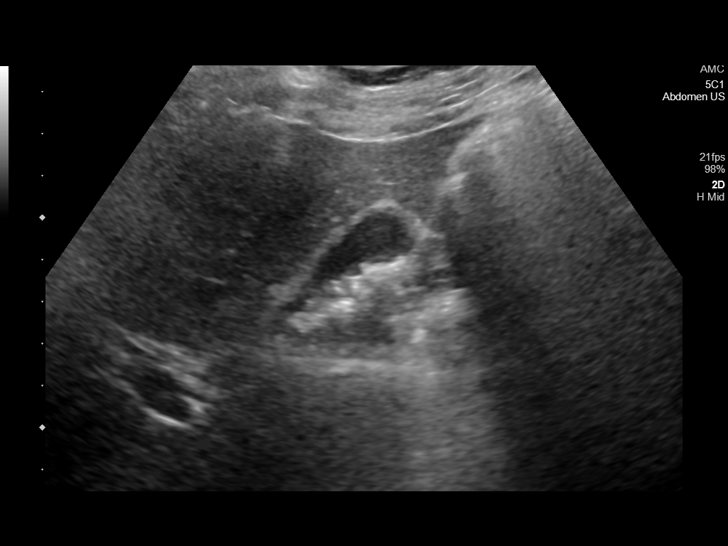
[im 8/29]
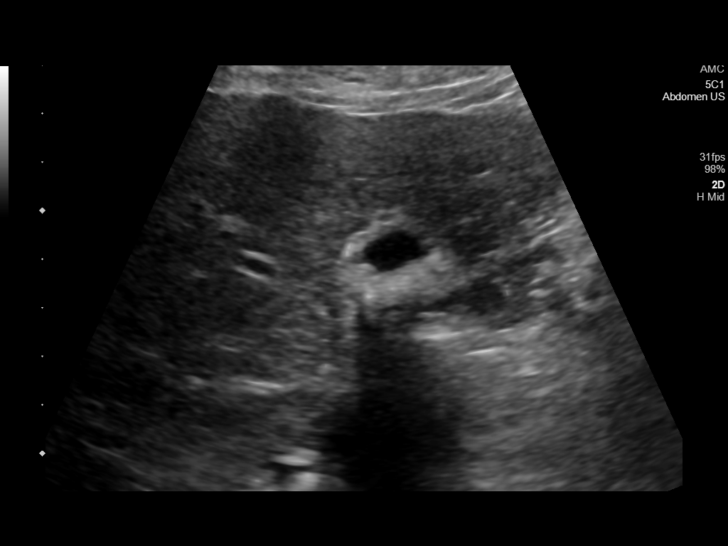
[im 10/29]
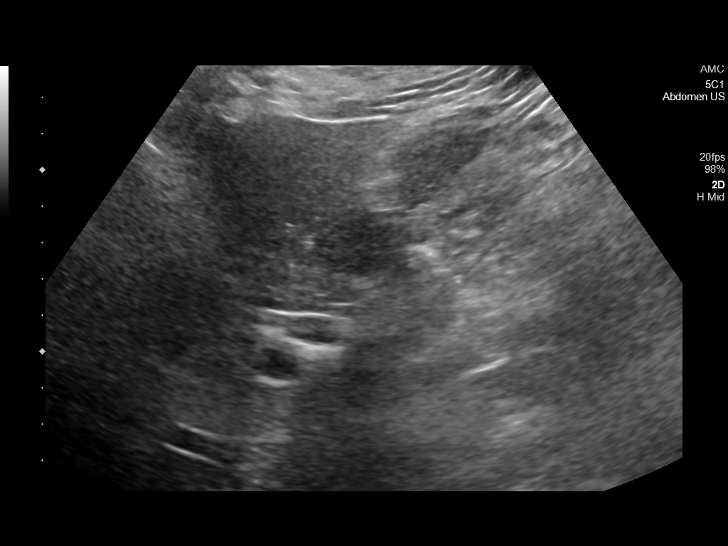
[im 11/29]
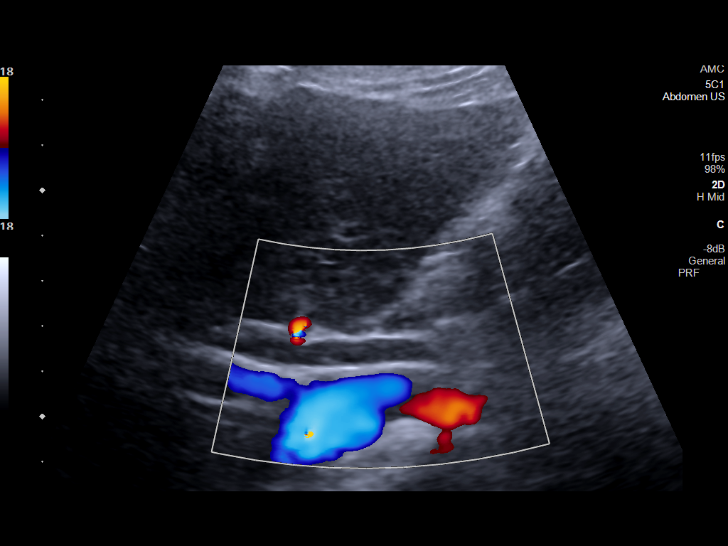
[im 13/29]
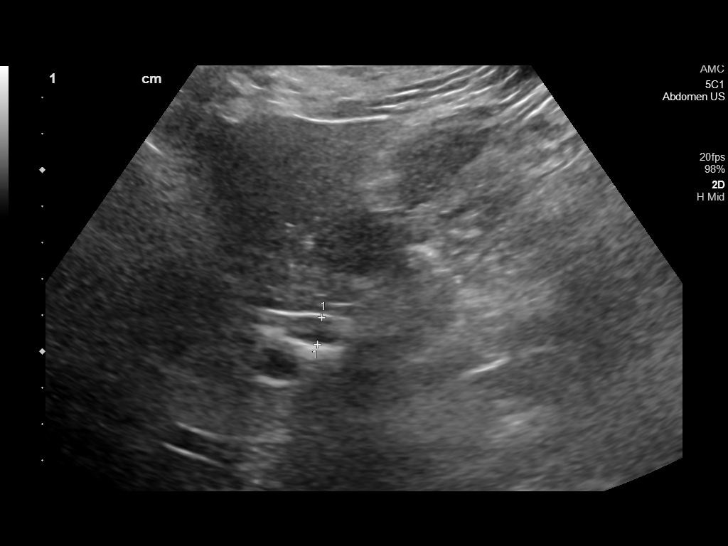
[im 16/29]
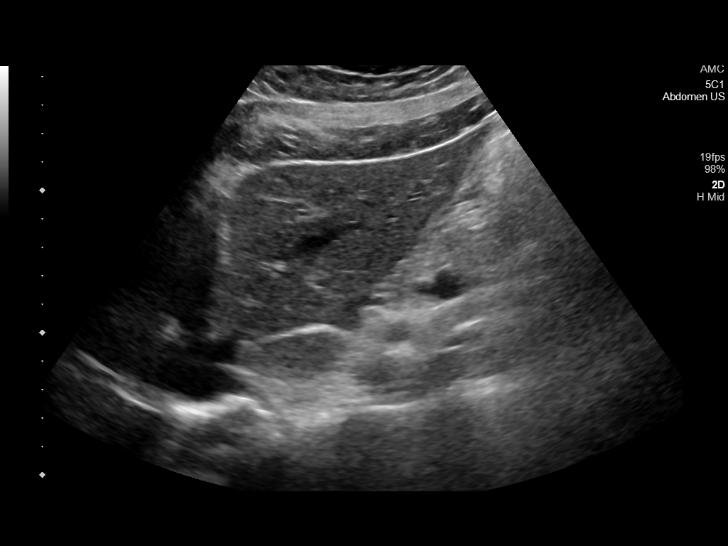
[im 18/29]
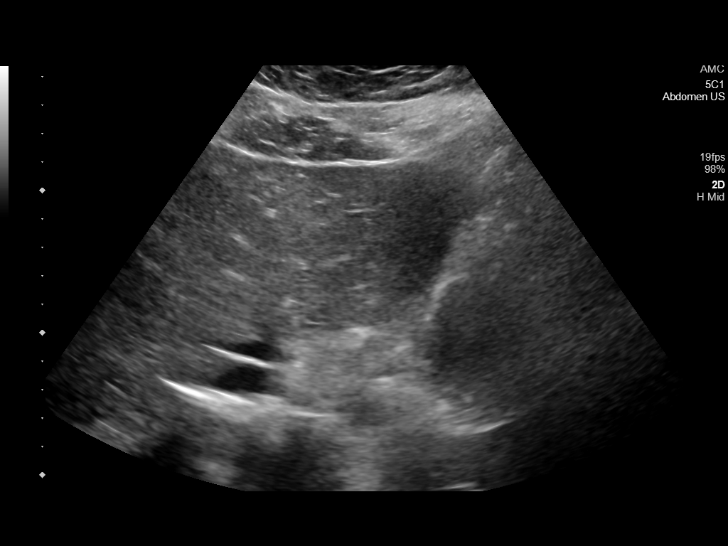
[im 19/29]
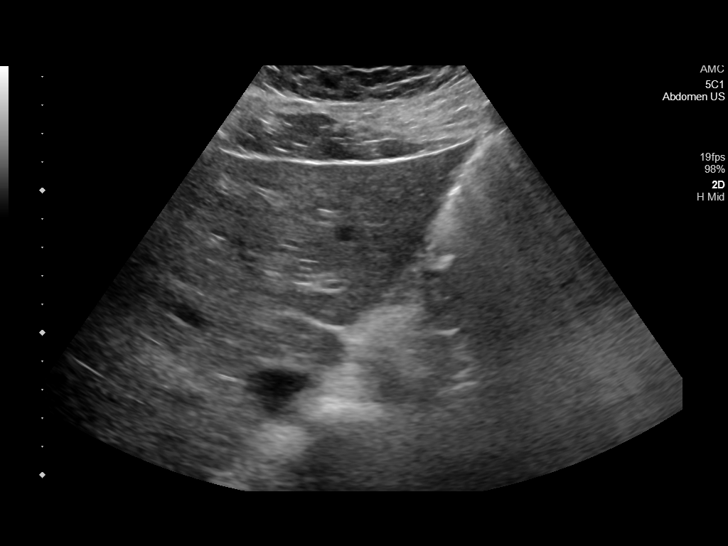
[im 22/29]
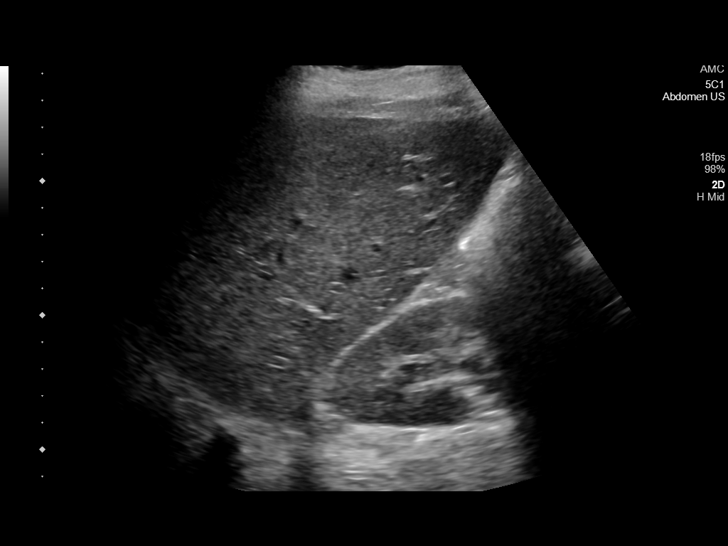
[im 24/29]
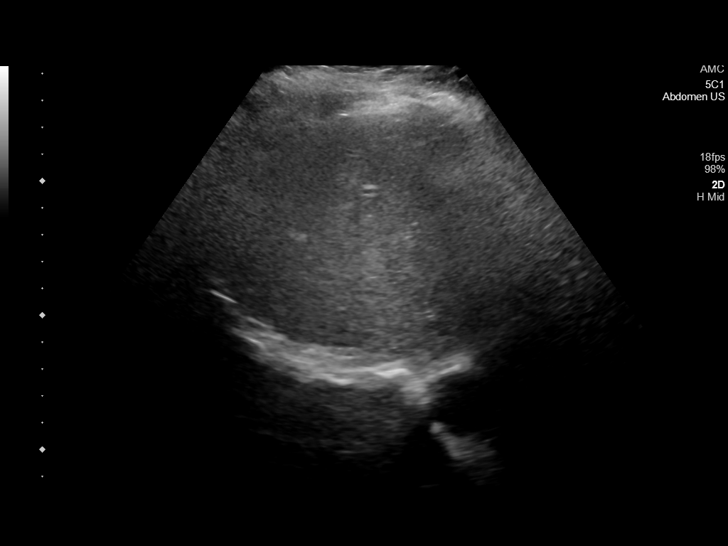
[im 26/29]
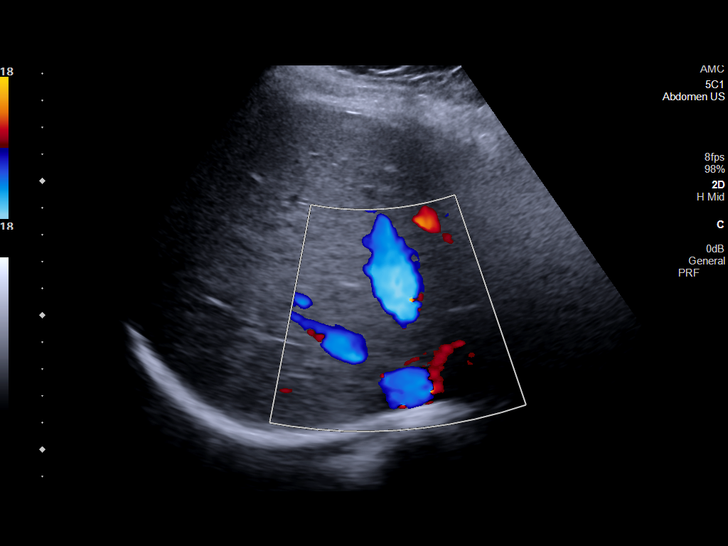
[im 29/29]
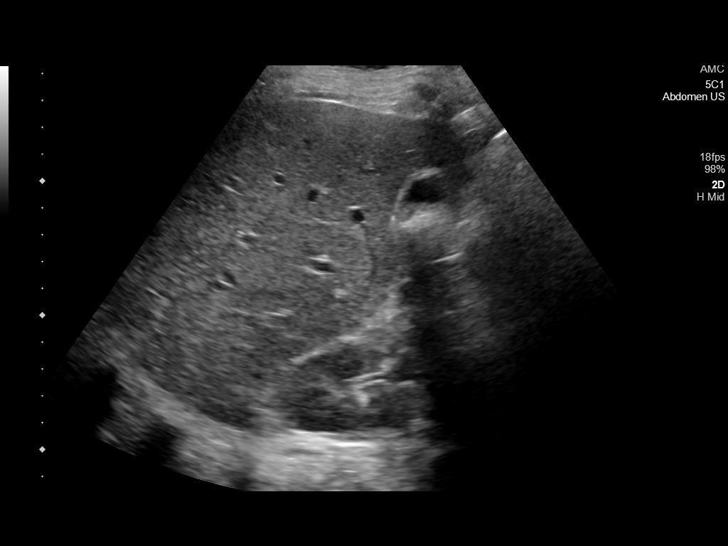

[14 of 25 positions shown; findings below may reference images not displayed]

FINDINGS: Gallbladder:

Mildly distended. Multiple small dependent stones. No wall
thickening or pericholecystic fluid. No sonographic Murphy's sign.

Common bile duct:

Diameter: 6 mm. No visualized duct stone. Distal duct not completely
imaged.

Liver:

No focal lesion identified. Within normal limits in parenchymal
echogenicity. Portal vein is patent on color Doppler imaging with
normal direction of blood flow towards the liver.

Other: None.
IMPRESSION: 1. Cholelithiasis without evidence of acute cholecystitis.
2. Dilated common bile duct to 6 mm. No visualized duct stone, but
the most distal aspect of the duct was not visualized. If there is
clinical concern for a distal duct stone, follow-up MRI/MRCP would
be recommended.

## 2021-12-21 DIAGNOSIS — N912 Amenorrhea, unspecified: Secondary | ICD-10-CM | POA: Insufficient documentation

## 2021-12-21 DIAGNOSIS — Z3482 Encounter for supervision of other normal pregnancy, second trimester: Secondary | ICD-10-CM | POA: Insufficient documentation

## 2022-01-14 ENCOUNTER — Other Ambulatory Visit: Payer: Self-pay

## 2022-01-14 ENCOUNTER — Emergency Department: Payer: Managed Care, Other (non HMO)

## 2022-01-14 ENCOUNTER — Emergency Department
Admission: EM | Admit: 2022-01-14 | Discharge: 2022-01-14 | Disposition: A | Discharge status: Home / Self Care | Payer: Managed Care, Other (non HMO) | Attending: Emergency Medicine | Admitting: Emergency Medicine

## 2022-01-14 DIAGNOSIS — R1011 Right upper quadrant pain: Secondary | ICD-10-CM

## 2022-01-14 DIAGNOSIS — Z3A12 12 weeks gestation of pregnancy: Secondary | ICD-10-CM | POA: Insufficient documentation

## 2022-01-14 DIAGNOSIS — O99611 Diseases of the digestive system complicating pregnancy, first trimester: Secondary | ICD-10-CM | POA: Diagnosis not present

## 2022-01-14 DIAGNOSIS — O26891 Other specified pregnancy related conditions, first trimester: Secondary | ICD-10-CM | POA: Diagnosis present

## 2022-01-14 DIAGNOSIS — K8020 Calculus of gallbladder without cholecystitis without obstruction: Secondary | ICD-10-CM | POA: Diagnosis not present

## 2022-01-14 LAB — CBC
HCT: 35.9 % — ABNORMAL LOW (ref 36.0–46.0)
Hemoglobin: 12.6 g/dL (ref 12.0–15.0)
MCH: 30.2 pg (ref 26.0–34.0)
MCHC: 35.1 g/dL (ref 30.0–36.0)
MCV: 86.1 fL (ref 80.0–100.0)
Platelets: 220 10*3/uL (ref 150–400)
RBC: 4.17 MIL/uL (ref 3.87–5.11)
RDW: 12.2 % (ref 11.5–15.5)
WBC: 13.7 10*3/uL — ABNORMAL HIGH (ref 4.0–10.5)
nRBC: 0 % (ref 0.0–0.2)

## 2022-01-14 LAB — HEPATIC FUNCTION PANEL
ALT: 18 U/L (ref 0–44)
AST: 34 U/L (ref 15–41)
Albumin: 3.7 g/dL (ref 3.5–5.0)
Alkaline Phosphatase: 55 U/L (ref 38–126)
Bilirubin, Direct: 0.1 mg/dL (ref 0.0–0.2)
Indirect Bilirubin: 0.4 mg/dL (ref 0.3–0.9)
Total Bilirubin: 0.5 mg/dL (ref 0.3–1.2)
Total Protein: 7 g/dL (ref 6.5–8.1)

## 2022-01-14 LAB — BASIC METABOLIC PANEL
Anion gap: 11 (ref 5–15)
BUN: 7 mg/dL (ref 6–20)
CO2: 23 mmol/L (ref 22–32)
Calcium: 9.6 mg/dL (ref 8.9–10.3)
Chloride: 103 mmol/L (ref 98–111)
Creatinine, Ser: 0.48 mg/dL (ref 0.44–1.00)
GFR, Estimated: >60 mL/min (ref >60)
Glucose, Bld: 113 mg/dL — ABNORMAL HIGH (ref 70–99)
Potassium: 3.7 mmol/L (ref 3.5–5.1)
Sodium: 137 mmol/L (ref 135–145)

## 2022-01-14 LAB — LIPASE, BLOOD: Lipase: 170 U/L — ABNORMAL HIGH (ref 11–51)

## 2022-01-14 LAB — POC URINE PREG, ED: Preg Test, Ur: POSITIVE — AB

## 2022-01-14 MED ORDER — ONDANSETRON 4 MG PO TBDP: 4.0000 mg | ORAL_TABLET | Freq: Three times a day (TID) | ORAL | 0 refills | Status: DC | PRN

## 2022-01-14 NOTE — ED Triage Notes (Signed)
Pt to ED for mid abd pain that started around 1400 with emesis that has since subsided.  Reports hx of gallstones.  [redacted] weeks pregnant, 2nd pregnancy. Denies vaginal bleeding Nad noted

## 2022-01-14 NOTE — ED Notes (Signed)
See triage note  Presents with some abd pain  states she is about 12 weeks preg  and recently dx'd with  gallstones

## 2022-01-14 NOTE — ED Provider Notes (Incomplete)
   [facility] Provider Note    Event Date/Time   First MD Initiated Contact with Patient 01/14/22 1823     (approximate)   History   Abdominal Pain   HPI  Felicia Curtis is a 26 y.o. female  ***       Physical Exam   Triage Vital Signs: ED Triage Vitals  Enc Vitals Group     BP 01/14/22 1623 117/67     Pulse Rate 01/14/22 1623 74     Resp 01/14/22 1623 18     Temp 01/14/22 1623 97.8 F (36.6 C)     Temp Source 01/14/22 1623 Oral     SpO2 01/14/22 1623 100 %     Weight 01/14/22 1636 195 lb (88.5 kg)     Height 01/14/22 1636 5\' 6"  (1.676 m)     Head Circumference --      Peak Flow --      Pain Score 01/14/22 1636 0     Pain Loc --      Pain Edu? --      Excl. in GC? --     Most recent vital signs: Vitals:   01/14/22 1623  BP: 117/67  Pulse: 74  Resp: 18  Temp: 97.8 F (36.6 C)  SpO2: 100%    Physical Exam       IMPRESSION / MDM / ASSESSMENT AND PLAN / ED COURSE  I reviewed the triage vital signs and the nursing notes.  ***    EKG  ***  RADIOLOGY I independently reviewed imaging, my interpretation of imaging: ***      ED Results / Procedures / Treatments   Labs (all labs ordered are listed, but only abnormal results are displayed) Labs interpreted as -    Labs Reviewed  CBC - Abnormal; Notable for the following components:      Result Value   WBC 13.7 (*)    HCT 35.9 (*)    All other components within normal limits  BASIC METABOLIC PANEL - Abnormal; Notable for the following components:   Glucose, Bld 113 (*)    All other components within normal limits  LIPASE, BLOOD - Abnormal; Notable for the following components:   Lipase 170 (*)    All other components within normal limits  POC URINE PREG, ED - Abnormal; Notable for the following components:   Preg Test, Ur POSITIVE (*)    All other components within normal limits       PROCEDURES:  Critical Care performed:  No  Procedures  Patient's presentation is most consistent with {EM COPA:27473}   MEDICATIONS ORDERED IN ED: Medications - No data to display  FINAL CLINICAL IMPRESSION(S) / ED DIAGNOSES   Final diagnoses:  None     Rx / DC Orders   ED Discharge Orders     None        Note:  This document was prepared using Dragon voice recognition software and may include unintentional dictation errors.

## 2022-01-14 NOTE — ED Provider Notes (Signed)
[facility] Provider Note    Event Date/Time   First MD Initiated Contact with Patient 01/14/22 1823     (approximate)   History   Abdominal Pain   HPI  Felicia Curtis is a 26 y.o. female G2 P1 at [redacted] weeks gestational age who presents to the emergency department with nausea and vomiting.  Endorses an episode of nausea and vomiting with upper abdominal pain that radiated to her right side that started after she was eating lunch today.  States that it lasted for approximately 1 hour with multiple episodes of nausea and vomiting and then resolved.  States that it felt similar to prior gallstone attacks that she had in her first pregnancy.  States that she was evaluated by general surgery and they told her that the safest time to have her gallbladder removed was in her second trimester, states that she did not follow-up after pregnancy given that her symptoms had resolved.  Denies any alcohol use or marijuana use.  Denies any fever or chills.     Physical Exam   Triage Vital Signs: ED Triage Vitals  Enc Vitals Group     BP 01/14/22 1623 117/67     Pulse Rate 01/14/22 1623 74     Resp 01/14/22 1623 18     Temp 01/14/22 1623 97.8 F (36.6 C)     Temp Source 01/14/22 1623 Oral     SpO2 01/14/22 1623 100 %     Weight 01/14/22 1636 195 lb (88.5 kg)     Height 01/14/22 1636 5\' 6"  (1.676 m)     Head Circumference --      Peak Flow --      Pain Score 01/14/22 1636 0     Pain Loc --      Pain Edu? --      Excl. in GC? --     Most recent vital signs: Vitals:   01/14/22 1623  BP: 117/67  Pulse: 74  Resp: 18  Temp: 97.8 F (36.6 C)  SpO2: 100%    Physical Exam Constitutional:      Appearance: She is well-developed.  HENT:     Head: Atraumatic.  Eyes:     Conjunctiva/sclera: Conjunctivae normal.  Cardiovascular:     Rate and Rhythm: Regular rhythm.  Pulmonary:     Effort: No respiratory distress.  Abdominal:     General: There is no  distension.     Tenderness: There is no abdominal tenderness.  Musculoskeletal:        General: Normal range of motion.     Cervical back: Normal range of motion.  Skin:    General: Skin is warm.  Neurological:     Mental Status: She is alert. Mental status is at baseline.          IMPRESSION / MDM / ASSESSMENT AND PLAN / ED COURSE  I reviewed the triage vital signs and the nursing notes.  Differential diagnosis including nausea and vomiting of pregnancy, gallstones, pancreatitis, acute cholecystitis, gastritis/PUD   RADIOLOGY I independently reviewed imaging, my interpretation of imaging: Ultrasound right upper quadrant showed no signs of acute cholecystitis.  Did see gallstones.  Read as cholelithiasis but no signs of acute cholecystitis.  Normal common bile duct.    POCUS of her uterus with good fetal movement and good fetal heartbeat measuring at approximately [redacted] weeks gestational age  ED Results / Procedures / Treatments   Labs (all labs ordered are listed, but  only abnormal results are displayed) Labs interpreted as -   Leukocytosis.  Lipase mildly elevated but not 5 times upper limit of normal have a low suspicion for acute pancreatitis.  Normal LFTs.  Pregnancy test is positive.  Labs Reviewed  CBC - Abnormal; Notable for the following components:      Result Value   WBC 13.7 (*)    HCT 35.9 (*)    All other components within normal limits  BASIC METABOLIC PANEL - Abnormal; Notable for the following components:   Glucose, Bld 113 (*)    All other components within normal limits  LIPASE, BLOOD - Abnormal; Notable for the following components:   Lipase 170 (*)    All other components within normal limits  POC URINE PREG, ED - Abnormal; Notable for the following components:   Preg Test, Ur POSITIVE (*)    All other components within normal limits  HEPATIC FUNCTION PANEL    Reevaluation patient able to tolerate p.o.  Patient given information to follow-up  as an outpatient with general surgery.  Given return precautions for recurrent symptoms.  Given a prescription for antiemetics.  Discussed only taking Tylenol for pain while pregnant.  No questions or concerns at time of discharge.  Discussed follow-up with primary care doctor and given information for outpatient surgery.   PROCEDURES:  Critical Care performed: No  Procedures  Patient's presentation is most consistent with acute presentation with potential threat to life or bodily function.   MEDICATIONS ORDERED IN ED: Medications - No data to display  FINAL CLINICAL IMPRESSION(S) / ED DIAGNOSES   Final diagnoses:  Right upper quadrant abdominal pain  [redacted] weeks gestation of pregnancy  Gallstones     Rx / DC Orders   ED Discharge Orders          Ordered    ondansetron (ZOFRAN-ODT) 4 MG disintegrating tablet  Every 8 hours PRN        01/14/22 2020             Note:  This document was prepared using Dragon voice recognition software and may include unintentional dictation errors.   Corena Herter, MD 01/14/22 2024

## 2022-01-20 ENCOUNTER — Ambulatory Visit (INDEPENDENT_AMBULATORY_CARE_PROVIDER_SITE_OTHER): Payer: Managed Care, Other (non HMO) | Admitting: Surgery

## 2022-01-20 ENCOUNTER — Encounter: Payer: Self-pay | Admitting: Surgery

## 2022-01-20 ENCOUNTER — Other Ambulatory Visit: Payer: Self-pay

## 2022-01-20 VITALS — BP 108/74 | HR 68 | Temp 98.2°F | Ht 66.0 in | Wt 190.0 lb

## 2022-01-20 DIAGNOSIS — K8020 Calculus of gallbladder without cholecystitis without obstruction: Secondary | ICD-10-CM

## 2022-01-20 DIAGNOSIS — K648 Other hemorrhoids: Secondary | ICD-10-CM | POA: Insufficient documentation

## 2022-01-20 MED ORDER — URSODIOL 200 MG PO CAPS: 200.0000 mg | ORAL_CAPSULE | Freq: Three times a day (TID) | ORAL | 1 refills | Status: DC

## 2022-01-20 NOTE — Progress Notes (Signed)
01/20/2022  Reason for Visit: Symptomatic cholelithiasis  History of Present Illness: Felicia Curtis is a 26 y.o. female presenting for evaluation of symptomatic cholelithiasis.  The patient reports having history of symptomatic cholelithiasis during her first pregnancy in 2021.  When this started getting worse, she was in her third trimester and decided against surgery.  After her pregnancy, she had no recurrence of her symptoms until earlier this month.  She is in her second pregnancy and is about 12 weeks and 3 days today.  She presented to the emergency room on 01/14/2022 with the same symptoms that she had had in the past which consisted of epigastric abdominal pain with associated nausea and vomiting.  Her workup in the ED showed normal LFTs and U/S which showed cholelithiasis.    Since her visit to the ED, she has not had other episodes.  She is 12 weeks and 3 days pregnant today.  Denies any current abdominal pain, nausea, or vomiting.  She is wondering what to do and is debating if surgery is the correct pathway as her last pregnancy had very frequent episodes of biliary colic towards the end.  Past Medical History: Past Medical History:  Diagnosis Date   Acute appendicitis 09/20/2013   Gallstones 11/07/2019     Past Surgical History: Past Surgical History:  Procedure Laterality Date   APPENDECTOMY  08/2013    Home Medications: Prior to Admission medications   Medication Sig Start Date End Date Taking? Authorizing Provider  Ursodiol 200 MG CAPS Take 200 mg by mouth 3 (three) times daily. 01/20/22  Yes Olean Ree, MD    Allergies: Allergies  Allergen Reactions   Sulfa Antibiotics Rash and Other (See Comments)    Social History:  reports that she has never smoked. She has never used smokeless tobacco. She reports that she does not drink alcohol and does not use drugs.   Family History: Family History  Problem Relation Age of Onset   Healthy Mother    Healthy  Father     Review of Systems: Review of Systems  Constitutional:  Negative for chills and fever.  HENT:  Negative for hearing loss.   Respiratory:  Negative for shortness of breath.   Cardiovascular:  Negative for chest pain.  Gastrointestinal:  Positive for abdominal pain, nausea and vomiting.  Genitourinary:  Negative for dysuria.  Musculoskeletal:  Negative for myalgias.  Skin:  Negative for itching.  Neurological:  Negative for dizziness.  Psychiatric/Behavioral:  Negative for depression.     Physical Exam BP 108/74   Pulse 68   Temp 98.2 F (36.8 C) (Oral)   Ht _0  (1.676 m)   Wt 190 lb (86.2 kg)   SpO2 99%   BMI 30.67 kg/m  CONSTITUTIONAL: No acute distress, well nourished. HEENT:  Normocephalic, atraumatic, extraocular motion intact. NECK: Trachea is midline, and there is no jugular venous distension.  RESPIRATORY:  Lungs are clear, and breath sounds are equal bilaterally. Normal respiratory effort without pathologic use of accessory muscles. CARDIOVASCULAR: Heart is regular without murmurs, gallops, or rubs. GI: The abdomen is soft, non-distended, currently non-tender to palpation.  Unable to palpate uterus at this point. MUSCULOSKELETAL:  Normal muscle strength and tone in all four extremities.  No peripheral edema or cyanosis. SKIN: Skin turgor is normal. There are no pathologic skin lesions.  NEUROLOGIC:  Motor and sensation is grossly normal.  Cranial nerves are grossly intact. PSYCH:  Alert and oriented to person, place and time. Affect is normal.  Laboratory Analysis: Labs from 01/14/22: Na 137, K 3.7, Cl 103, CO2 23, BUN 7, Cr 0.48.  Total bili 0.5, AST 34, ALT 18, Alk Phos 55, Lipase 170.  WBC 13.7, Hgb 12.6, Hct 35.9, Plt 220  Imaging: Ultrasound RUQ 01/14/22: IMPRESSION: Cholelithiasis without sonographic evidence of acute cholecystitis.  Assessment and Plan: This is a 26 y.o. female with symptomatic cholelithiasis  --Discussed with the patient  the findings on her ultrasound and the symptoms that are characteristic of biliary colic.  Her last ultrasound showed cholelithiasis and sludge.  The patient reports that between pregnancies she had no issues at all.  Discussed with her that with pregnancy, potentially more stones can develop or there can be cholestasis which would contribute to the symptoms during pregnancy.  She is wondering if she needs surgery and when.  She is hesitant to do any surgery at this point, but also is worried about how the rest of her pregnancy could go with her symptoms as she feels the end of her last pregnancy was miserable with the biliary colic episodes.  Discussed with her the options for conservative management with dietary changes, and also with the addition of ursodiol to help with gallstone dissolution and cholestasis; vs cholecystectomy during pregnancy; vs cholecystectomy after pregnancy.  Discussed that cholecystectomy during pregnancy is safe based on current data.  Discussed that if we proceed with surgery during pregnancy, would ideally be better before she reaches 20-22 weeks so the uterus is inferior to the umbilicus, but surgery is safe in either trimester.  After further discussion, she remains hesitant about surgery during pregnancy.  Discussed with her that we can start her on ursodiol and dietary changes and to follow expectantly over the next month.  If the medication seems to be helping and she wishes to continue waiting, we can proceed that way.  However, if there's no improvement, we would consider surgery more. --She is in agreement with this plan and all of her questions have been answered. --Will sent rx for ursodiol.  I spent 45 minutes dedicated to the care of this patient on the date of this encounter to include pre-visit review of records, face-to-face time with the patient discussing diagnosis and management, and any post-visit coordination of care.   Melvyn Neth, [facility]

## 2022-01-20 NOTE — Patient Instructions (Addendum)
Please see your follow up appointment listed below. Pick up your medication at the pharmacy.    Cholelithiasis  Cholelithiasis is a disease in which gallstones form in the gallbladder. The gallbladder is an organ that stores bile. Bile is a fluid that helps to digest fats. Gallstones begin as small crystals and can slowly grow into stones. They may cause no symptoms until they block the gallbladder duct, or cystic duct, when the gallbladder tightens (contracts) after food is eaten. This can cause pain and is known as a gallbladder attack, or biliary colic. There are two main types of gallstones: Cholesterol stones. These are the most common type of gallstone. These stones are made of hardened cholesterol and are usually yellow-green in color. Cholesterol is a fat-like substance that is made in the liver. Pigment stones. These are dark in color and are made of a red-yellow substance, called bilirubin,that forms when hemoglobin from red blood cells breaks down. What are the causes? This condition may be caused by an imbalance in the different parts that make bile. This can happen if the bile: Has too much bilirubin. This can happen in certain blood diseases, such as sickle cell anemia. Has too much cholesterol. Does not have enough bile salts. These salts help the body absorb and digest fats. In some cases, this condition can also be caused by the gallbladder not emptying completely or often enough. This is common during pregnancy. What increases the risk? The following factors may make you more likely to develop this condition: Being female. Having multiple pregnancies. Health care providers sometimes advise removing diseased gallbladders before future pregnancies. Eating a diet that is heavy in fried foods, fat, and refined carbohydrates, such as white bread and white rice. Being obese. Being older than age 42. Using medicines that contain female hormones (estrogen) for a long time. Losing  weight quickly. Having a family history of gallstones. Having certain medical problems, such as: Diabetes mellitus. Cystic fibrosis. Crohn's disease. Cirrhosis or other long-term (chronic) liver disease. Certain blood diseases, such as sickle cell anemia or leukemia. What are the signs or symptoms? In many cases, having gallstones causes no symptoms. When you have gallstones but do not have symptoms, you have silent gallstones. If a gallstone blocks your bile duct, it can cause a gallbladder attack. The main symptom of a gallbladder attack is sudden pain in the upper right part of the abdomen. The pain: Usually comes at night or after eating. Can last for one hour or more. Can spread to your right shoulder, back, or chest. Can feel like indigestion. This is discomfort, burning, or fullness in your upper abdomen. If the bile duct is blocked for more than a few hours, it can cause an infection or inflammation of your gallbladder (cholecystitis), liver, or pancreas. This can cause: Nausea or vomiting. Bloating. Pain in your abdomen that lasts for 5 hours or longer. Tenderness in your upper abdomen, often in the upper right section and under your rib cage. Fever or chills. Skin or the white parts of your eyes turning yellow (jaundice). This usually happens when a stone has blocked bile from passing through the common bile duct. Dark urine or light-colored stools. How is this diagnosed? This condition may be diagnosed based on: A physical exam. Your medical history. Ultrasound. CT scan. MRI. You may also have other tests, including: Blood tests to check for signs of an infection or inflammation. Cholescintigraphy, or HIDA scan. This is a scan of your gallbladder and bile ducts (biliary  system) using non-harmful radioactive material and special cameras that can see the radioactive material. Endoscopic retrograde cholangiopancreatogram. This involves inserting a small tube with a camera on  the end (endoscope) through your mouth to look at bile ducts and check for blockages. How is this treated? Treatment for this condition depends on the severity of the condition. Silent gallstones do not need treatment. Treatment may be needed if a blockage causes a gallbladder attack or other symptoms. Treatment may include: Home care, if symptoms are not severe. During a simple gallbladder attack, stop eating and drinking for 12-24 hours (except for water and clear liquids). This helps to "cool down" your gallbladder. After 1 or 2 days, you can start to eat a diet of simple or clear foods, such as broths and crackers. You may also need medicines for pain or nausea or both. If you have cholecystitis and an infection, you will need antibiotics. A hospital stay, if needed for pain control or for cholecystitis with severe infection. Cholecystectomy, or surgery to remove your gallbladder. This is the most common treatment if all other treatments have not worked. Medicines to break up gallstones. These are most effective at treating small gallstones. Medicines may be used for up to 6-12 months. Endoscopic retrograde cholangiopancreatogram. A small basket can be attached to the endoscope and used to capture and remove gallstones, mainly those that are in the common bile duct. Follow these instructions at home: Medicines Take over-the-counter and prescription medicines only as told by your health care provider. If you were prescribed an antibiotic medicine, take it as told by your health care provider. Do not stop taking the antibiotic even if you start to feel better. Ask your health care provider if the medicine prescribed to you requires you to avoid driving or using machinery. Eating and drinking Drink enough fluid to keep your urine pale yellow. This is important during a gallbladder attack. Water and clear liquids are preferred. Follow a healthy diet. This includes: Reducing fatty foods, such as  fried food and foods high in cholesterol. Reducing refined carbohydrates, such as white bread and white rice. Eating more fiber. Aim for foods such as almonds, fruit, and beans. Alcohol use If you drink alcohol: Limit how much you use to: 0-1 drink a day for nonpregnant women. 0-2 drinks a day for men. Be aware of how much alcohol is in your drink. In the [place] one drink equals one 12 oz bottle of beer (355 mL), one 5 oz glass of wine (148 mL), or one 1 oz glass of hard liquor (44 mL). General instructions Do not use any products that contain nicotine or tobacco, such as cigarettes, e-cigarettes, and chewing tobacco. If you need help quitting, ask your health care provider. Maintain a healthy weight. Keep all follow-up visits as told by your health care provider. These may include consultations with a surgeon or specialist. This is important. Where to find more information [employer] of Diabetes and Digestive and Kidney Diseases: [email or web address] Contact a health care provider if: You think you have had a gallbladder attack. You have been diagnosed with silent gallstones and you develop pain in your abdomen or indigestion. You begin to have attacks more often. You have dark urine or light-colored stools. Get help right away if: You have pain from a gallbladder attack that lasts for more than 2 hours. You have pain in your abdomen that lasts for more than 5 hours or is getting worse. You have a fever or chills. You have  nausea and vomiting that do not go away. You develop jaundice. Summary Cholelithiasis is a disease in which gallstones form in the gallbladder. This condition may be caused by an imbalance in the different parts that make bile. This can happen if your bile has too much bilirubin or cholesterol, or does not have enough bile salts. Treatment for gallstones depends on the severity of the condition. Silent gallstones do not need treatment. If gallstones cause a  gallbladder attack or other symptoms, treatment usually involves not eating or drinking anything. Treatment may also include pain medicines and antibiotics, and it sometimes includes a hospital stay. Surgery to remove the gallbladder is common if all other treatments have not worked. This information is not intended to replace advice given to you by your health care provider. Make sure you discuss any questions you have with your health care provider. Document Revised: 01/08/2019 Document Reviewed: 01/08/2019 Elsevier Patient Education  [place].     Gallbladder Eating Plan High blood cholesterol, obesity, a sedentary lifestyle, an unhealthy diet, and diabetes are risk factors for developing gallstones. If you have a gallbladder condition, you may have trouble digesting fats and tolerating high fat intake. Eating a low-fat diet can help reduce your symptoms and may be helpful before and after having surgery to remove your gallbladder (cholecystectomy). Your health care provider may recommend that you work with a dietitian to help you reduce the amount of fat in your diet. What are tips for following this plan? General guidelines Limit your fat intake to less than 30% of your total daily calories. If you eat around 1,800 calories each day, this means eating less than 60 grams (g) of fat per day. Fat is an important part of a healthy diet. Eating a low-fat diet can make it hard to maintain a healthy body weight. Ask your dietitian how much fat, calories, and other nutrients you need each day. Eat small, frequent meals throughout the day instead of three large meals. Drink at least 8-10 cups (1.9-2.4 L) of fluid a day. Drink enough fluid to keep your urine pale yellow. If you drink alcohol: Limit how much you have to: 0-1 drink a day for women who are not pregnant. 0-2 drinks a day for men. Know how much alcohol is in a drink. In the [place] one drink equals one 12 oz bottle of beer (355  mL), one 5 oz glass of wine (148 mL), or one 1 oz glass of hard liquor (44 mL). Reading food labels  Check nutrition facts on food labels for the amount of fat per serving. Choose foods with less than 3 grams of fat per serving. Shopping Choose nonfat and low-fat healthy foods. Look for the words "nonfat," "low-fat," or "fat-free." Avoid buying processed or prepackaged foods. Cooking Cook using low-fat methods, such as baking, broiling, grilling, or boiling. Cook with small amounts of healthy fats, such as olive oil, grapeseed oil, canola oil, avocado oil, or sunflower oil. What foods are recommended?  All fresh, frozen, or canned fruits and vegetables. Whole grains. Low-fat or nonfat (skim) milk and yogurt. Lean meat, skinless poultry, fish, eggs, and beans. Low-fat protein supplement powders or drinks. Spices and herbs. The items listed above may not be a complete list of foods and beverages you can eat and drink. Contact a dietitian for more information. What foods are not recommended? High-fat foods. These include baked goods, fast food, fatty cuts of meat, ice cream, french toast, sweet rolls, pizza, cheese bread, foods  covered with butter, creamy sauces, or cheese. Fried foods. These include french fries, tempura, battered fish, breaded chicken, fried breads, and sweets. Foods that cause bloating and gas. The items listed above may not be a complete list of foods that you should avoid. Contact a dietitian for more information. Summary A low-fat diet can be helpful if you have a gallbladder condition, or before and after gallbladder surgery. Limit your fat intake to less than 30% of your total daily calories. This is about 60 g of fat if you eat 1,800 calories each day. Eat small, frequent meals throughout the day instead of three large meals. This information is not intended to replace advice given to you by your health care provider. Make sure you discuss any questions you have  with your health care provider. Document Revised: 01/30/2021 Document Reviewed: 01/30/2021 Elsevier Patient Education  2023 [employer].

## 2022-01-25 ENCOUNTER — Encounter: Payer: Self-pay | Admitting: Surgery

## 2022-01-27 ENCOUNTER — Telehealth: Payer: Self-pay | Admitting: Surgery

## 2022-01-27 ENCOUNTER — Encounter: Payer: Self-pay | Admitting: Surgery

## 2022-01-27 NOTE — Telephone Encounter (Signed)
Patient was in on 01/20/22 for her gallbladder. She will be this weekend [redacted] weeks pregnant.  She did see her OB/gyn who said that if she were to have gallbladder surgery, would be best to do between 13 to 16 weeks.  Patient wants to proceed. Please advise regarding  scheduling of surgery.  Thanks

## 2022-01-27 NOTE — Telephone Encounter (Signed)
Patient has been advised of Pre-Admission date/time, and Surgery date at [facility].  Surgery Date: 02/11/22 Preadmission Testing Date: 02/05/22 (phone 8a-1p)  Patient has been made aware to call [phone], between 1-3:00pm the day before surgery, to find out what time to arrive for surgery.

## 2022-01-27 NOTE — Telephone Encounter (Signed)
11/29/233  Spoke with Dr. Feliberto Gottron and he recommends proceeding with cholecystectomy for this patient.  Spoke with patient and she's in agreement.  Will book surgery for 02/11/22 and will move her appointment with me to 01/29/22 for H&P update and discuss the surgical plan better.  Felicia Dodge, MD

## 2022-01-29 ENCOUNTER — Ambulatory Visit: Payer: Managed Care, Other (non HMO) | Admitting: Surgery

## 2022-01-29 ENCOUNTER — Encounter: Payer: Self-pay | Admitting: Surgery

## 2022-01-29 VITALS — BP 102/67 | HR 72 | Temp 98.0°F | Ht 66.0 in | Wt 187.0 lb

## 2022-01-29 DIAGNOSIS — K8020 Calculus of gallbladder without cholecystitis without obstruction: Secondary | ICD-10-CM | POA: Diagnosis not present

## 2022-01-29 NOTE — Progress Notes (Signed)
01/29/2022  History of Present Illness: Felicia Curtis is a 26 y.o. female presenting for follow-up of symptomatic cholelithiasis.  The patient was seen on 01/20/2022 and after further discussion, we decided on trying ursodiol to control her symptoms during pregnancy.  The patient reports that she took a dose of the medication and it caused her dizziness which she felt she was going to fall.  She saw Dr. Schermerhorn on 01/27/2022 as part of her routine follow-ups during her pregnancy he recommended that she have her surgery sooner.  As such, I called the patient and we have scheduled her for surgery on 02/11/2022.  She presents today for H&P update and to discuss surgery and answer any questions that she has.  She reports that she has not had any other episodes since her visit to the emergency room on 01/14/2022.  Past Medical History: Past Medical History:  Diagnosis Date   Acute appendicitis 09/20/2013   Gallstones 11/07/2019     Past Surgical History: Past Surgical History:  Procedure Laterality Date   APPENDECTOMY  08/2013    Home Medications: Prior to Admission medications   Medication Sig Start Date End Date Taking? Authorizing Provider  Ursodiol 200 MG CAPS Take 200 mg by mouth 3 (three) times daily. Patient not taking: Reported on 01/29/2022 01/20/22   Little Bashore, MD    Allergies: Allergies  Allergen Reactions   Sulfa Antibiotics Rash and Other (See Comments)    Review of Systems: Review of Systems  Constitutional:  Negative for chills and fever.  Respiratory:  Negative for shortness of breath.   Cardiovascular:  Negative for chest pain.  Gastrointestinal:  Negative for abdominal pain, nausea and vomiting.    Physical Exam BP 102/67   Pulse 72   Temp 98 F (36.7 C)   Ht 5' 6" (1.676 m)   Wt 187 lb (84.8 kg)   SpO2 98%   BMI 30.18 kg/m  CONSTITUTIONAL: No acute distress, well-nourished HEENT:  Normocephalic, atraumatic, extraocular motion  intact. RESPIRATORY:  Normal respiratory effort without pathologic use of accessory muscles. CARDIOVASCULAR: Regular rhythm and rate GI: Deferred today MUSCULOSKELETAL: No gait, no peripheral edema PSYCH:  Alert and oriented to person, place and time. Affect is normal.  Labs/Imaging: Ultrasound RUQ on 01/14/2022: IMPRESSION: Cholelithiasis without sonographic evidence of acute cholecystitis.  Assessment and Plan: This is a 26 y.o. female with symptomatic cholelithiasis.  - Discussed with her that given her intolerance to ursodiol, will agree with Dr. Scott Morgan's recommendation to proceed with surgery particularly before she reaches 20 to 22 weeks of gestational age as her fundal height will start reaching beyond the umbilicus.  She is currently about 13-1/2 weeks and is being scheduled for surgery in 2 weeks. - Discussed with her the plan for robotic assisted cholecystectomy and reviewed the surgery at length with her including the incisions, the risks of bleeding, infection, injury to surrounding structures, that this would be an outpatient procedure, monitoring for fetal heart tones before and after surgery, the use of ICG for better evaluation of the biliary anatomy, postoperative activity restrictions, pain control, and she is willing to proceed. - Patient scheduled for surgery on 02/11/2022.  All of her questions have been answered.  I spent 20 minutes dedicated to the care of this patient on the date of this encounter to include pre-visit review of records, face-to-face time with the patient discussing diagnosis and management, and any post-visit coordination of care.   Lyam Provencio Luis Masa Lubin, MD [facility]     

## 2022-01-29 NOTE — Patient Instructions (Signed)
You have requested to have your gallbladder removed on 02/11/22. This will be done at [facility] with Dr. Aleen Campi.  You will most likely be out of work 1-2 weeks for this surgery.  If you have FMLA or disability paperwork that needs filled out you may drop this off at our office or this can be faxed to [phone].  You will return after your post-op appointment with a lifting restriction for approximately 4 more weeks.  You will be able to eat anything you would like to following surgery. But, start by eating a bland diet and advance this as tolerated. The Gallbladder diet is below, please go as closely by this diet as possible prior to surgery to avoid any further attacks.  Please see the (blue)pre-care form that you have been given today. Our surgery scheduler will call you to verify surgery date and to go over information.   If you have any questions, please call our office.  Laparoscopic Cholecystectomy Laparoscopic cholecystectomy is surgery to remove the gallbladder. The gallbladder is located in the upper right part of the abdomen, behind the liver. It is a storage sac for bile, which is produced in the liver. Bile aids in the digestion and absorption of fats. Cholecystectomy is often done for inflammation of the gallbladder (cholecystitis). This condition is usually caused by a buildup of gallstones (cholelithiasis) in the gallbladder. Gallstones can block the flow of bile, and that can result in inflammation and pain. In severe cases, emergency surgery may be required. If emergency surgery is not required, you will have time to prepare for the procedure. Laparoscopic surgery is an alternative to open surgery. Laparoscopic surgery has a shorter recovery time. Your common bile duct may also need to be examined during the procedure. If stones are found in the common bile duct, they may be removed. LET [facility] CARE PROVIDER KNOW ABOUT: Any allergies you have. All medicines you  are taking, including vitamins, herbs, eye drops, creams, and over-the-counter medicines. Previous problems you or members of your family have had with the use of anesthetics. Any blood disorders you have. Previous surgeries you have had.  Any medical conditions you have. RISKS AND COMPLICATIONS Generally, this is a safe procedure. However, problems may occur, including: Infection. Bleeding. Allergic reactions to medicines. Damage to other structures or organs. A stone remaining in the common bile duct. A bile leak from the cyst duct that is clipped when your gallbladder is removed. The need to convert to open surgery, which requires a larger incision in the abdomen. This may be necessary if your surgeon thinks that it is not safe to continue with a laparoscopic procedure. BEFORE THE PROCEDURE Ask your health care provider about: Changing or stopping your regular medicines. This is especially important if you are taking diabetes medicines or blood thinners. Taking medicines such as aspirin and ibuprofen. These medicines can thin your blood. Do not take these medicines before your procedure if your health care provider instructs you not to. Follow instructions from your health care provider about eating or drinking restrictions. Let your health care provider know if you develop a cold or an infection before surgery. Plan to have someone take you home after the procedure. Ask your health care provider how your surgical site will be marked or identified. You may be given antibiotic medicine to help prevent infection. PROCEDURE To reduce your risk of infection: Your health care team will wash or sanitize their hands. Your skin will be washed with soap.  An IV tube may be inserted into one of your veins. You will be given a medicine to make you fall asleep (general anesthetic). A breathing tube will be placed in your mouth. The surgeon will make several small cuts (incisions) in your  abdomen. A thin, lighted tube (laparoscope) that has a tiny camera on the end will be inserted through one of the small incisions. The camera on the laparoscope will send a picture to a TV screen (monitor) in the operating room. This will give the surgeon a good view inside your abdomen. A gas will be pumped into your abdomen. This will expand your abdomen to give the surgeon more room to perform the surgery. Other tools that are needed for the procedure will be inserted through the other incisions. The gallbladder will be removed through one of the incisions. After your gallbladder has been removed, the incisions will be closed with stitches (sutures), staples, or skin glue. Your incisions may be covered with a bandage (dressing). The procedure may vary among health care providers and hospitals. AFTER THE PROCEDURE Your blood pressure, heart rate, breathing rate, and blood oxygen level will be monitored often until the medicines you were given have worn off. You will be given medicines as needed to control your pain.   This information is not intended to replace advice given to you by your health care provider. Make sure you discuss any questions you have with your health care provider.   Document Released: 02/15/2005 Document Revised: 11/06/2014 Document Reviewed: 09/27/2012 Elsevier Interactive Patient Education 2016 Elsevier Inc.   Low-Fat Diet for Gallbladder Conditions A low-fat diet can be helpful if you have pancreatitis or a gallbladder condition. With these conditions, your pancreas and gallbladder have trouble digesting fats. A healthy eating plan with less fat will help rest your pancreas and gallbladder and reduce your symptoms. WHAT DO I NEED TO KNOW ABOUT THIS DIET? Eat a low-fat diet. Reduce your fat intake to less than 20-30% of your total daily calories. This is less than 50-60 g of fat per day. Remember that you need some fat in your diet. Ask your dietician what your daily  goal should be. Choose nonfat and low-fat healthy foods. Look for the words "nonfat," "low fat," or "fat free." As a guide, look on the label and choose foods with less than 3 g of fat per serving. Eat only one serving. Avoid alcohol. Do not smoke. If you need help quitting, talk with your health care provider. Eat small frequent meals instead of three large heavy meals. WHAT FOODS CAN I EAT? Grains Include healthy grains and starches such as potatoes, wheat bread, fiber-rich cereal, and brown rice. Choose whole grain options whenever possible. In adults, whole grains should account for 45-65% of your daily calories.  Fruits and Vegetables Eat plenty of fruits and vegetables. Fresh fruits and vegetables add fiber to your diet. Meats and Other Protein Sources Eat lean meat such as chicken and pork. Trim any fat off of meat before cooking it. Eggs, fish, and beans are other sources of protein. In adults, these foods should account for 10-35% of your daily calories. Dairy Choose low-fat milk and dairy options. Dairy includes fat and protein, as well as calcium.  Fats and Oils Limit high-fat foods such as fried foods, sweets, baked goods, sugary drinks.  Other Creamy sauces and condiments, such as mayonnaise, can add extra fat. Think about whether or not you need to use them, or use smaller amounts or low  fat options. WHAT FOODS ARE NOT RECOMMENDED? High fat foods, such as: [employer]. Ice cream. [place] toast. Sweet rolls. Pizza. Cheese bread. Foods covered with batter, butter, creamy sauces, or cheese. Fried foods. Sugary drinks and desserts. Foods that cause gas or bloating   This information is not intended to replace advice given to you by your health care provider. Make sure you discuss any questions you have with your health care provider.   Document Released: 02/20/2013 Document Reviewed: 02/20/2013 Elsevier Interactive Patient Education [employer].

## 2022-01-29 NOTE — H&P (View-Only) (Signed)
01/29/2022  History of Present Illness: Felicia Curtis is a 26 y.o. female presenting for follow-up of symptomatic cholelithiasis.  The patient was seen on 01/20/2022 and after further discussion, we decided on trying ursodiol to control her symptoms during pregnancy.  The patient reports that she took a dose of the medication and it caused her dizziness which she felt she was going to fall.  She saw Dr. Feliberto Gottron on 01/27/2022 as part of her routine follow-ups during her pregnancy he recommended that she have her surgery sooner.  As such, I called the patient and we have scheduled her for surgery on 02/11/2022.  She presents today for H&P update and to discuss surgery and answer any questions that she has.  She reports that she has not had any other episodes since her visit to the emergency room on 01/14/2022.  Past Medical History: Past Medical History:  Diagnosis Date   Acute appendicitis 09/20/2013   Gallstones 11/07/2019     Past Surgical History: Past Surgical History:  Procedure Laterality Date   APPENDECTOMY  08/2013    Home Medications: Prior to Admission medications   Medication Sig Start Date End Date Taking? Authorizing Provider  Ursodiol 200 MG CAPS Take 200 mg by mouth 3 (three) times daily. Patient not taking: Reported on 01/29/2022 01/20/22   Henrene Dodge, MD    Allergies: Allergies  Allergen Reactions   Sulfa Antibiotics Rash and Other (See Comments)    Review of Systems: Review of Systems  Constitutional:  Negative for chills and fever.  Respiratory:  Negative for shortness of breath.   Cardiovascular:  Negative for chest pain.  Gastrointestinal:  Negative for abdominal pain, nausea and vomiting.    Physical Exam BP 102/67   Pulse 72   Temp 98 F (36.7 C)   Ht 5\' 6"  (1.676 m)   Wt 187 lb (84.8 kg)   SpO2 98%   BMI 30.18 kg/m  CONSTITUTIONAL: No acute distress, well-nourished HEENT:  Normocephalic, atraumatic, extraocular motion  intact. RESPIRATORY:  Normal respiratory effort without pathologic use of accessory muscles. CARDIOVASCULAR: Regular rhythm and rate GI: Deferred today MUSCULOSKELETAL: No gait, no peripheral edema PSYCH:  Alert and oriented to person, place and time. Affect is normal.  Labs/Imaging: Ultrasound RUQ on 01/14/2022: IMPRESSION: Cholelithiasis without sonographic evidence of acute cholecystitis.  Assessment and Plan: This is a 26 y.o. female with symptomatic cholelithiasis.  - Discussed with her that given her intolerance to ursodiol, will agree with Dr. 30 recommendation to proceed with surgery particularly before she reaches 20 to 22 weeks of gestational age as her fundal height will start reaching beyond the umbilicus.  She is currently about 13-1/2 weeks and is being scheduled for surgery in 2 weeks. - Discussed with her the plan for robotic assisted cholecystectomy and reviewed the surgery at length with her including the incisions, the risks of bleeding, infection, injury to surrounding structures, that this would be an outpatient procedure, monitoring for fetal heart tones before and after surgery, the use of ICG for better evaluation of the biliary anatomy, postoperative activity restrictions, pain control, and she is willing to proceed. - Patient scheduled for surgery on 02/11/2022.  All of her questions have been answered.  I spent 20 minutes dedicated to the care of this patient on the date of this encounter to include pre-visit review of records, face-to-face time with the patient discussing diagnosis and management, and any post-visit coordination of care.   02/13/2022, MD [facility]

## 2022-02-05 ENCOUNTER — Other Ambulatory Visit: Payer: Self-pay

## 2022-02-05 ENCOUNTER — Encounter
Admission: RE | Admit: 2022-02-05 | Discharge: 2022-02-05 | Disposition: A | Discharge status: Home / Self Care | Payer: Managed Care, Other (non HMO) | Source: Ambulatory Visit | Attending: Surgery

## 2022-02-05 NOTE — Patient Instructions (Addendum)
Your procedure is scheduled on: 02/11/22 - Thursday Report to the Registration Desk on the 1st floor of the [facility]. To find out your arrival time, please call [phone] between 1PM - 3PM on: 02/10/22 - Wednesday If your arrival time is 6:00 am, do not arrive prior to that time as the [facility] entrance doors do not open until 6:00 am.  REMEMBER: Instructions that are not followed completely may result in serious medical risk, up to and including death; or upon the discretion of your surgeon and anesthesiologist your surgery may need to be rescheduled.  Do not eat food after midnight the night before surgery.  No gum chewing, lozengers or hard candies.  You may however, drink CLEAR liquids up to 2 hours before you are scheduled to arrive for your surgery. Do not drink anything within 2 hours of your scheduled arrival time.  Clear liquids include: - water  - apple juice without pulp - gatorade (not RED colors) - black coffee or tea (Do NOT add milk or creamers to the coffee or tea) Do NOT drink anything that is not on this list.  TAKE THESE MEDICATIONS THE MORNING OF SURGERY WITH A SIP OF WATER: NONE  One week prior to surgery: Stop Anti-inflammatories (NSAIDS) such as Advil, Aleve, Ibuprofen, Motrin, Naproxen, Naprosyn and Aspirin based products such as Excedrin, Goodys Powder, BC Powder.  Stop ANY OVER THE COUNTER supplements until after surgery.  You may however, continue to take Tylenol if needed for pain up until the day of surgery.  No Alcohol for 24 hours before or after surgery.  No Smoking including e-cigarettes for 24 hours prior to surgery.  No chewable tobacco products for at least 6 hours prior to surgery.  No nicotine patches on the day of surgery.  Do not use any "recreational" drugs for at least a week prior to your surgery.  Please be advised that the combination of cocaine and anesthesia may have negative outcomes, up to and including death. If you  test positive for cocaine, your surgery will be cancelled.  On the morning of surgery brush your teeth with toothpaste and water, you may rinse your mouth with mouthwash if you wish. Do not swallow any toothpaste or mouthwash.  Use CHG Soap or wipes as directed on instruction sheet.  Do not wear jewelry, make-up, hairpins, clips or nail polish.  Do not wear lotions, powders, or perfumes.   Do not shave body from the neck down 48 hours prior to surgery just in case you cut yourself which could leave a site for infection. Also, freshly shaved skin may become irritated if using the CHG soap.  Contact lenses, hearing aids and dentures may not be worn into surgery.  Do not bring valuables to the hospital. [facility] is not responsible for any missing/lost belongings or valuables.   Notify your doctor if there is any change in your medical condition (cold, fever, infection).  Wear comfortable clothing (specific to your surgery type) to the hospital.  After surgery, you can help prevent lung complications by doing breathing exercises.  Take deep breaths and cough every 1-2 hours. Your doctor may order a device called an Incentive Spirometer to help you take deep breaths. When coughing or sneezing, hold a pillow firmly against your incision with both hands. This is called "splinting." Doing this helps protect your incision. It also decreases belly discomfort.  If you are being admitted to the hospital overnight, leave your suitcase in the car. After  surgery it may be brought to your room.  If you are being discharged the day of surgery, you will not be allowed to drive home. You will need a responsible adult (18 years or older) to drive you home and stay with you that night.   If you are taking public transportation, you will need to have a responsible adult (18 years or older) with you. Please confirm with your physician that it is acceptable to use public transportation.   Please call the  [facility]. at [phone] if you have any questions about these instructions.  Surgery Visitation Policy:  Patients undergoing a surgery or procedure may have two family members or support persons with them as long as the person is not COVID-19 positive or experiencing its symptoms.   Inpatient Visitation:    Visiting hours are 7 a.m. to 8 p.m. Up to four visitors are allowed at one time in a patient room. The visitors may rotate out with other people during the day. One designated support person (adult) may remain overnight.  Due to an increase in RSV and influenza rates and associated hospitalizations, children ages 7 and under will not be able to visit patients in [facility]. Masks continue to be strongly recommended.

## 2022-02-11 ENCOUNTER — Other Ambulatory Visit: Payer: Self-pay

## 2022-02-11 ENCOUNTER — Encounter: Payer: Self-pay | Admitting: Surgery

## 2022-02-11 ENCOUNTER — Ambulatory Visit: Payer: Managed Care, Other (non HMO) | Admitting: Certified Registered"

## 2022-02-11 ENCOUNTER — Ambulatory Visit
Admission: RE | Admit: 2022-02-11 | Discharge: 2022-02-11 | Disposition: A | Discharge status: Home / Self Care | Payer: Managed Care, Other (non HMO) | Attending: Surgery | Admitting: Surgery

## 2022-02-11 ENCOUNTER — Encounter
Admission: RE | Disposition: A | Discharge status: Home / Self Care | Payer: Self-pay | Source: Home / Self Care | Attending: Surgery

## 2022-02-11 DIAGNOSIS — Z3A15 15 weeks gestation of pregnancy: Secondary | ICD-10-CM | POA: Diagnosis not present

## 2022-02-11 DIAGNOSIS — O99612 Diseases of the digestive system complicating pregnancy, second trimester: Secondary | ICD-10-CM | POA: Insufficient documentation

## 2022-02-11 DIAGNOSIS — K8010 Calculus of gallbladder with chronic cholecystitis without obstruction: Secondary | ICD-10-CM | POA: Insufficient documentation

## 2022-02-11 DIAGNOSIS — K8020 Calculus of gallbladder without cholecystitis without obstruction: Secondary | ICD-10-CM

## 2022-02-11 SURGERY — CHOLECYSTECTOMY, ROBOT-ASSISTED, LAPAROSCOPIC
Anesthesia: General

## 2022-02-11 MED ORDER — PHENYLEPHRINE 80 MCG/ML (10ML) SYRINGE FOR IV PUSH (FOR BLOOD PRESSURE SUPPORT)
PREFILLED_SYRINGE | INTRAVENOUS | Status: DC | PRN
  Administered 2022-02-11 (×2): 80 ug via INTRAVENOUS

## 2022-02-11 MED ORDER — BUPIVACAINE-EPINEPHRINE (PF) 0.25% -1:200000 IJ SOLN
INTRAMUSCULAR | Status: DC | PRN
  Administered 2022-02-11: 30 mL

## 2022-02-11 MED ORDER — PROPOFOL 10 MG/ML IV BOLUS
INTRAVENOUS | Status: DC | PRN
  Administered 2022-02-11: 200 mg via INTRAVENOUS
  Administered 2022-02-11: 100 mg via INTRAVENOUS

## 2022-02-11 MED ORDER — LACTATED RINGERS IV SOLN: INTRAVENOUS | Status: DC

## 2022-02-11 MED ORDER — ACETAMINOPHEN 500 MG PO TABS: 1000.0000 mg | ORAL_TABLET | ORAL | Status: AC

## 2022-02-11 MED ORDER — OXYCODONE HCL 5 MG/5ML PO SOLN: 5.0000 mg | Freq: Once | ORAL | Status: AC | PRN

## 2022-02-11 MED ORDER — CEFAZOLIN SODIUM-DEXTROSE 2-4 GM/100ML-% IV SOLN
2.0000 g | INTRAVENOUS | Status: AC
  Administered 2022-02-11: 2 g via INTRAVENOUS

## 2022-02-11 MED ORDER — ACETAMINOPHEN 500 MG PO TABS: 1000.0000 mg | ORAL_TABLET | Freq: Four times a day (QID) | ORAL | Status: DC | PRN

## 2022-02-11 MED ORDER — SODIUM CHLORIDE 0.9 % IV SOLN: 6.2500 mg | Freq: Once | INTRAVENOUS | Status: DC

## 2022-02-11 MED ORDER — DEXAMETHASONE SODIUM PHOSPHATE 10 MG/ML IJ SOLN
INTRAMUSCULAR | Status: DC | PRN
  Administered 2022-02-11: 10 mg via INTRAVENOUS

## 2022-02-11 MED ORDER — OXYCODONE HCL 5 MG PO TABS
5.0000 mg | ORAL_TABLET | Freq: Once | ORAL | Status: AC | PRN
  Administered 2022-02-11: 5 mg via ORAL

## 2022-02-11 MED ORDER — LIDOCAINE HCL (CARDIAC) PF 100 MG/5ML IV SOSY
PREFILLED_SYRINGE | INTRAVENOUS | Status: DC | PRN
  Administered 2022-02-11: 80 mg via INTRAVENOUS

## 2022-02-11 MED ORDER — KETOROLAC TROMETHAMINE 30 MG/ML IJ SOLN
INTRAMUSCULAR | Status: DC | PRN
  Administered 2022-02-11: 30 mg via INTRAVENOUS

## 2022-02-11 MED ORDER — OXYCODONE HCL 5 MG PO TABS: 5.0000 mg | ORAL_TABLET | ORAL | 0 refills | Status: DC | PRN

## 2022-02-11 MED ORDER — SUCCINYLCHOLINE CHLORIDE 200 MG/10ML IV SOSY
PREFILLED_SYRINGE | INTRAVENOUS | Status: DC | PRN
  Administered 2022-02-11: 80 mg via INTRAVENOUS

## 2022-02-11 MED ORDER — ORAL CARE MOUTH RINSE: 15.0000 mL | Freq: Once | OROMUCOSAL | Status: AC

## 2022-02-11 MED ORDER — FENTANYL CITRATE (PF) 100 MCG/2ML IJ SOLN
INTRAMUSCULAR | Status: AC
  Filled 2022-02-11: qty 2

## 2022-02-11 MED ORDER — CHLORHEXIDINE GLUCONATE 0.12 % MT SOLN: 15.0000 mL | Freq: Once | OROMUCOSAL | Status: AC

## 2022-02-11 MED ORDER — CEFAZOLIN SODIUM-DEXTROSE 2-4 GM/100ML-% IV SOLN
INTRAVENOUS | Status: AC
  Filled 2022-02-11: qty 100

## 2022-02-11 MED ORDER — HYDROMORPHONE HCL 1 MG/ML IJ SOLN: 0.2500 mg | INTRAMUSCULAR | Status: DC | PRN

## 2022-02-11 MED ORDER — ACETAMINOPHEN 500 MG PO TABS
ORAL_TABLET | ORAL | Status: AC
  Administered 2022-02-11: 1000 mg via ORAL
  Filled 2022-02-11: qty 2

## 2022-02-11 MED ORDER — OXYCODONE HCL 5 MG PO TABS
ORAL_TABLET | ORAL | Status: AC
  Filled 2022-02-11: qty 1

## 2022-02-11 MED ORDER — MIDAZOLAM HCL 2 MG/2ML IJ SOLN
INTRAMUSCULAR | Status: AC
  Filled 2022-02-11: qty 2

## 2022-02-11 MED ORDER — SEVOFLURANE IN SOLN
RESPIRATORY_TRACT | Status: AC
  Filled 2022-02-11: qty 250

## 2022-02-11 MED ORDER — FENTANYL CITRATE (PF) 100 MCG/2ML IJ SOLN
INTRAMUSCULAR | Status: DC | PRN
  Administered 2022-02-11: 25 ug via INTRAVENOUS
  Administered 2022-02-11: 75 ug via INTRAVENOUS
  Administered 2022-02-11 (×4): 25 ug via INTRAVENOUS

## 2022-02-11 MED ORDER — ONDANSETRON HCL 4 MG/2ML IJ SOLN
INTRAMUSCULAR | Status: DC | PRN
  Administered 2022-02-11: 4 mg via INTRAVENOUS

## 2022-02-11 MED ORDER — INDOCYANINE GREEN 25 MG IV SOLR
2.5000 mg | INTRAVENOUS | Status: AC
  Administered 2022-02-11: 2.5 mg via INTRAVENOUS
  Filled 2022-02-11: qty 1

## 2022-02-11 MED ORDER — SUGAMMADEX SODIUM 200 MG/2ML IV SOLN
INTRAVENOUS | Status: DC | PRN
  Administered 2022-02-11: 200 mg via INTRAVENOUS

## 2022-02-11 MED ORDER — PROMETHAZINE HCL 25 MG/ML IJ SOLN
INTRAMUSCULAR | Status: AC
  Administered 2022-02-11: 6.25 mg via INTRAVENOUS
  Filled 2022-02-11: qty 1

## 2022-02-11 MED ORDER — FAMOTIDINE 20 MG PO TABS: 20.0000 mg | ORAL_TABLET | Freq: Once | ORAL | Status: AC

## 2022-02-11 MED ORDER — CHLORHEXIDINE GLUCONATE CLOTH 2 % EX PADS
6.0000 | MEDICATED_PAD | Freq: Once | CUTANEOUS | Status: AC
  Administered 2022-02-11: 6 via TOPICAL

## 2022-02-11 MED ORDER — PROPOFOL 10 MG/ML IV BOLUS
INTRAVENOUS | Status: AC
  Filled 2022-02-11: qty 40

## 2022-02-11 MED ORDER — CHLORHEXIDINE GLUCONATE 0.12 % MT SOLN
OROMUCOSAL | Status: AC
  Administered 2022-02-11: 15 mL via OROMUCOSAL
  Filled 2022-02-11: qty 15

## 2022-02-11 MED ORDER — EPHEDRINE SULFATE (PRESSORS) 50 MG/ML IJ SOLN
INTRAMUSCULAR | Status: DC | PRN
  Administered 2022-02-11 (×2): 5 mg via INTRAVENOUS

## 2022-02-11 MED ORDER — ROCURONIUM BROMIDE 100 MG/10ML IV SOLN
INTRAVENOUS | Status: DC | PRN
  Administered 2022-02-11: 40 mg via INTRAVENOUS
  Administered 2022-02-11: 20 mg via INTRAVENOUS

## 2022-02-11 MED ORDER — PROMETHAZINE HCL 25 MG/ML IJ SOLN: 6.2500 mg | INTRAMUSCULAR | Status: DC | PRN

## 2022-02-11 MED ORDER — BUPIVACAINE-EPINEPHRINE (PF) 0.25% -1:200000 IJ SOLN
INTRAMUSCULAR | Status: AC
  Filled 2022-02-11: qty 30

## 2022-02-11 MED ORDER — FAMOTIDINE 20 MG PO TABS
ORAL_TABLET | ORAL | Status: AC
  Administered 2022-02-11: 20 mg via ORAL
  Filled 2022-02-11: qty 1

## 2022-02-11 MED ORDER — SODIUM CHLORIDE (PF) 0.9 % IJ SOLN
INTRAMUSCULAR | Status: AC
  Filled 2022-02-11: qty 10

## 2022-02-11 SURGICAL SUPPLY — 56 items
ADH SKN CLS APL DERMABOND .7 (GAUZE/BANDAGES/DRESSINGS) ×1
BAG PRESSURE INF REUSE 1000 (BAG) IMPLANT
CANNULA CAP OBTURATR AIRSEAL 8 (CAP) IMPLANT
CANNULA REDUC XI 12-8 STAPL (CANNULA) ×1
CANNULA REDUCER 12-8 DVNC XI (CANNULA) ×1 IMPLANT
CLIP LIGATING HEMO O LOK GREEN (MISCELLANEOUS) ×1 IMPLANT
CUP MEDICINE 2OZ PLAST GRAD ST (MISCELLANEOUS) ×1 IMPLANT
DERMABOND ADVANCED .7 DNX12 (GAUZE/BANDAGES/DRESSINGS) ×1 IMPLANT
DRAPE ARM DVNC X/XI (DISPOSABLE) ×4 IMPLANT
DRAPE COLUMN DVNC XI (DISPOSABLE) ×1 IMPLANT
DRAPE DA VINCI XI ARM (DISPOSABLE) ×4
DRAPE DA VINCI XI COLUMN (DISPOSABLE) ×1
ELECT CAUTERY BLADE TIP 2.5 (TIP) ×1
ELECT REM PT RETURN 9FT ADLT (ELECTROSURGICAL) ×1
ELECTRODE CAUTERY BLDE TIP 2.5 (TIP) ×1 IMPLANT
ELECTRODE REM PT RTRN 9FT ADLT (ELECTROSURGICAL) ×1 IMPLANT
GLOVE SURG SYN 7.0 (GLOVE) ×3 IMPLANT
GLOVE SURG SYN 7.0 PF PI (GLOVE) ×2 IMPLANT
GLOVE SURG SYN 7.5  E (GLOVE) ×3
GLOVE SURG SYN 7.5 E (GLOVE) ×3 IMPLANT
GLOVE SURG SYN 7.5 PF PI (GLOVE) ×2 IMPLANT
GOWN STRL REUS W/ TWL LRG LVL3 (GOWN DISPOSABLE) ×4 IMPLANT
GOWN STRL REUS W/TWL LRG LVL3 (GOWN DISPOSABLE) ×3
IRRIGATOR SUCT 8 DISP DVNC XI (IRRIGATION / IRRIGATOR) IMPLANT
IRRIGATOR SUCTION 8MM XI DISP (IRRIGATION / IRRIGATOR)
IV NS 1000ML (IV SOLUTION)
IV NS 1000ML BAXH (IV SOLUTION) IMPLANT
KIT PINK PAD W/HEAD ARE REST (MISCELLANEOUS) ×1
KIT PINK PAD W/HEAD ARM REST (MISCELLANEOUS) ×1 IMPLANT
LABEL OR SOLS (LABEL) ×1 IMPLANT
MANIFOLD NEPTUNE II (INSTRUMENTS) ×1 IMPLANT
NEEDLE HYPO 22GX1.5 SAFETY (NEEDLE) ×1 IMPLANT
NS IRRIG 500ML POUR BTL (IV SOLUTION) ×1 IMPLANT
OBTURATOR OPTICAL STANDARD 8MM (TROCAR) ×1
OBTURATOR OPTICAL STND 8 DVNC (TROCAR) ×1
OBTURATOR OPTICALSTD 8 DVNC (TROCAR) ×1 IMPLANT
PACK LAP CHOLECYSTECTOMY (MISCELLANEOUS) ×1 IMPLANT
PENCIL SMOKE EVACUATOR (MISCELLANEOUS) ×1 IMPLANT
SEAL CANN UNIV 5-8 DVNC XI (MISCELLANEOUS) ×3 IMPLANT
SEAL XI 5MM-8MM UNIVERSAL (MISCELLANEOUS) ×3
SET TUBE FILTERED XL AIRSEAL (SET/KITS/TRAYS/PACK) IMPLANT
SET TUBE SMOKE EVAC HIGH FLOW (TUBING) ×1 IMPLANT
SOLUTION ELECTROLUBE (MISCELLANEOUS) ×1 IMPLANT
SPIKE FLUID TRANSFER (MISCELLANEOUS) ×1 IMPLANT
SPONGE T-LAP 18X18 ~~LOC~~+RFID (SPONGE) IMPLANT
SPONGE T-LAP 4X18 ~~LOC~~+RFID (SPONGE) ×1 IMPLANT
STAPLER CANNULA SEAL DVNC XI (STAPLE) ×1 IMPLANT
STAPLER CANNULA SEAL XI (STAPLE) ×1
SUT MNCRL AB 4-0 PS2 18 (SUTURE) ×1 IMPLANT
SUT VIC AB 3-0 SH 27 (SUTURE)
SUT VIC AB 3-0 SH 27X BRD (SUTURE) IMPLANT
SUT VICRYL 0 UR6 27IN ABS (SUTURE) ×2 IMPLANT
SYS BAG RETRIEVAL 10MM (BASKET) ×1
SYSTEM BAG RETRIEVAL 10MM (BASKET) ×1 IMPLANT
TRAP FLUID SMOKE EVACUATOR (MISCELLANEOUS) ×1 IMPLANT
WATER STERILE IRR 500ML POUR (IV SOLUTION) ×1 IMPLANT

## 2022-02-11 NOTE — Interval H&P Note (Signed)
History and Physical Interval Note:  02/11/2022 7:11 AM  Felicia Curtis  has presented today for surgery, with the diagnosis of symptomatic cholelithiasis.  The various methods of treatment have been discussed with the patient and family. After consideration of risks, benefits and other options for treatment, the patient has consented to  Procedure(s): XI ROBOTIC ASSISTED LAPAROSCOPIC CHOLECYSTECTOMY (N/A) INDOCYANINE GREEN FLUORESCENCE IMAGING (ICG) (N/A) as a surgical intervention.  The patient's history has been reviewed, patient examined, no change in status, stable for surgery.  I have reviewed the patient's chart and labs.  Questions were answered to the patient's satisfaction.     Veanna Dower

## 2022-02-11 NOTE — Op Note (Signed)
  Procedure Date:  02/11/2022  Pre-operative Diagnosis:  Symptomatic cholelithiasis  Post-operative Diagnosis: Symptomatic cholelithiasis  Procedure:  Robotic assisted cholecystectomy with ICG FireFly cholangiogram  Surgeon:  Howie Ill, MD  Anesthesia:  General endotracheal  Estimated Blood Loss:  3 ml  Specimens:  gallbladder  Complications:  None  Indications for Procedure:  This is a 26 y.o. female approximately [redacted] weeks pregnant, who presents with abdominal pain and workup revealing symptomatic cholelithiasis.  The benefits, complications, treatment options, and expected outcomes were discussed with the patient. The risks of bleeding, infection, recurrence of symptoms, failure to resolve symptoms, bile duct damage, bile duct leak, retained common bile duct stone, bowel injury, and need for further procedures were all discussed with the patient and she was willing to proceed.  Description of Procedure: The patient was correctly identified in the preoperative area and brought into the operating room.  The patient was placed supine with VTE prophylaxis in place.  Appropriate time-outs were performed.  Anesthesia was induced and the patient was intubated.  Appropriate antibiotics were infused.  The abdomen was prepped and draped in a sterile fashion. An infraumbilical incision was made. A cutdown technique was used to enter the abdominal cavity without injury, and a 12 mm robotic port was inserted.  Pneumoperitoneum was obtained with appropriate opening pressures.  Three 8-mm ports were placed in the mid abdomen at the level of the umbilicus under direct visualization.  The DaVinci platform was docked, camera targeted, and instruments were placed under direct visualization.  The gallbladder was identified.  The fundus was grasped and retracted cephalad.  Adhesions were lysed bluntly and with electrocautery. The infundibulum was grasped and retracted laterally, exposing the peritoneum  overlying the gallbladder.  This was incised with electrocautery and extended on either side of the gallbladder.  FireFly cholangiogram was then obtained, and we were able to clearly identify the cystic duct and common bile duct.  The cystic duct and cystic artery were carefully dissected with combination of cautery and blunt dissection.  Both were clipped twice proximally and once distally, cutting in between.  The gallbladder was taken from the gallbladder fossa in a retrograde fashion with electrocautery. The gallbladder was placed in an Endocatch bag. The liver bed was inspected and any bleeding was controlled with electrocautery. The right upper quadrant was then inspected again revealing intact clips, no bleeding, and no ductal injury.  The 8 mm ports were removed under direct visualization and the 12 mm port was removed.  The Endocatch bag was brought out via the umbilical incision. The fascial opening was closed using 0 vicryl suture.  Local anesthetic was infused in all incisions and the incisions were closed with 4-0 Monocryl.  The wounds were cleaned and sealed with DermaBond.  The patient was emerged from anesthesia and extubated and brought to the recovery room for further management.  The patient tolerated the procedure well and all counts were correct at the end of the case.   Howie Ill, MD

## 2022-02-11 NOTE — Anesthesia Procedure Notes (Addendum)
Date/Time: 02/11/2022 7:53 AM  Performed by: Gigi Gin, CRNAPre-anesthesia Checklist: Patient identified, Emergency Drugs available, Suction available, Patient being monitored and Timeout performed Patient Re-evaluated:Patient Re-evaluated prior to induction Oxygen Delivery Method: Circle system utilized Preoxygenation: Pre-oxygenation with 100% oxygen Induction Type: IV induction, Rapid sequence and Cricoid Pressure applied Laryngoscope Size: Mac and 4 Grade View: Grade I Tube type: Oral Tube size: 7.0 mm Number of attempts: 1 Airway Equipment and Method: Stylet Secured at: 22 (Right lip) cm Tube secured with: Tape Dental Injury: Teeth and Oropharynx as per pre-operative assessment  Comments: RSI  No ventilation.   Easy direct quick grade one intubation.

## 2022-02-11 NOTE — Anesthesia Preprocedure Evaluation (Addendum)
Anesthesia Evaluation  Patient identified by MRN, date of birth, ID band Patient awake    Reviewed: Allergy & Precautions, NPO status , Patient's Chart, lab work & pertinent test results  History of Anesthesia Complications Negative for: history of anesthetic complications  Airway Mallampati: II  TM Distance: >3 FB Neck ROM: full    Dental  (+) Teeth Intact   Pulmonary neg pulmonary ROS   Pulmonary exam normal        Cardiovascular negative cardio ROS Normal cardiovascular exam     Neuro/Psych negative neurological ROS  negative psych ROS   GI/Hepatic negative GI ROS, Neg liver ROS,,,  Endo/Other  negative endocrine ROS    Renal/GU negative Renal ROS  negative genitourinary   Musculoskeletal   Abdominal   Peds  Hematology negative hematology ROS (+)   Anesthesia Other Findings Past Medical History: 09/20/2013: Acute appendicitis 11/07/2019: Gallstones  Past Surgical History: 08/2013: APPENDECTOMY  BMI 30   Reproductive/Obstetrics (+) Pregnancy (16wks)                             Anesthesia Physical Anesthesia Plan  ASA: 2  Anesthesia Plan: General   Post-op Pain Management: Tylenol PO (pre-op)* and Dilaudid IV   Induction: Intravenous  PONV Risk Score and Plan: 3 and Midazolam, Ondansetron, Treatment may vary due to age or medical condition and Dexamethasone  Airway Management Planned: Oral ETT  Additional Equipment:   Intra-op Plan:   Post-operative Plan: Extubation in OR  Informed Consent: I have reviewed the patients History and Physical, chart, labs and discussed the procedure including the risks, benefits and alternatives for the proposed anesthesia with the patient or authorized representative who has indicated his/her understanding and acceptance.     Dental Advisory Given  Plan Discussed with: Anesthesiologist, CRNA and Surgeon  Anesthesia Plan  Comments: (Patient consented for risks of anesthesia including but not limited to:  - adverse reactions to medications - damage to eyes, teeth, lips or other oral mucosa - nerve damage due to positioning  - sore throat or hoarseness - Damage to heart, brain, nerves, lungs, other parts of body or loss of life  Discussed risks of anesthesia in pregnancy including but not limited to aspiration, DVT/PE, exposure to teratogenic drugs, preterm labor, and fetal death.  Patient voiced understanding.)        Anesthesia Quick Evaluation

## 2022-02-11 NOTE — Transfer of Care (Addendum)
Immediate Anesthesia Transfer of Care Note  Patient: Felicia Curtis  Procedure(s) Performed: XI ROBOTIC ASSISTED LAPAROSCOPIC CHOLECYSTECTOMY INDOCYANINE GREEN FLUORESCENCE IMAGING (ICG)  Patient Location: PACU  Anesthesia Type:General  Level of Consciousness: awake and alert   Airway & Oxygen Therapy: Patient Spontanous Breathing and Patient connected to face mask oxygen  Post-op Assessment: Report given to RN  Post vital signs: Reviewed  Last Vitals:  Vitals Value Taken Time  BP 116/66 02/11/22 0933  Temp 36.3 C 02/11/22 0933  Pulse 98 02/11/22 0936  Resp 18 02/11/22 0936  SpO2 100 % 02/11/22 0936  Vitals shown include unvalidated device data.  Last Pain:  Vitals:   02/11/22 0933  PainSc: 5          Complications: No notable events documented.

## 2022-02-11 NOTE — Progress Notes (Signed)
Fetal Heart Tones prior to surgery 152 bpm

## 2022-02-11 NOTE — OR Nursing (Signed)
FHT post op 138, found in LLQ by L&D RN Maralyn Sago

## 2022-02-11 NOTE — Discharge Instructions (Signed)

## 2022-02-12 LAB — SURGICAL PATHOLOGY

## 2022-02-14 NOTE — Anesthesia Postprocedure Evaluation (Signed)
Anesthesia Post Note  Patient: SHAVONNA CORELLA  Procedure(s) Performed: XI ROBOTIC ASSISTED LAPAROSCOPIC CHOLECYSTECTOMY INDOCYANINE GREEN FLUORESCENCE IMAGING (ICG)  Patient location during evaluation: PACU Anesthesia Type: General Level of consciousness: awake and alert Pain management: pain level controlled Vital Signs Assessment: post-procedure vital signs reviewed and stable Respiratory status: spontaneous breathing, nonlabored ventilation, respiratory function stable and patient connected to nasal cannula oxygen Cardiovascular status: blood pressure returned to baseline and stable Postop Assessment: no apparent nausea or vomiting Anesthetic complications: no   No notable events documented.   Last Vitals:  Vitals:   02/11/22 1001 02/11/22 1048  BP: 109/76 99/62  Pulse: 82 70  Resp: 16   Temp: (!) 36.1 C   SpO2: 99% 99%    Last Pain:  Vitals:   02/12/22 0927  TempSrc:   PainSc: 4                  Yevette Edwards

## 2022-02-24 ENCOUNTER — Ambulatory Visit: Payer: Managed Care, Other (non HMO) | Admitting: Surgery

## 2022-02-26 ENCOUNTER — Encounter: Payer: Self-pay | Admitting: Surgery

## 2022-02-26 ENCOUNTER — Other Ambulatory Visit: Payer: Self-pay

## 2022-02-26 ENCOUNTER — Ambulatory Visit
Admission: EM | Admit: 2022-02-26 | Discharge: 2022-02-26 | Disposition: A | Discharge status: Home / Self Care | Payer: Managed Care, Other (non HMO) | Attending: Family Medicine | Admitting: Family Medicine

## 2022-02-26 ENCOUNTER — Ambulatory Visit (INDEPENDENT_AMBULATORY_CARE_PROVIDER_SITE_OTHER): Payer: Managed Care, Other (non HMO) | Admitting: Surgery

## 2022-02-26 ENCOUNTER — Encounter: Payer: Self-pay | Admitting: Emergency Medicine

## 2022-02-26 VITALS — BP 115/77 | HR 102 | Temp 97.9°F | Ht 66.0 in | Wt 185.0 lb

## 2022-02-26 DIAGNOSIS — O98512 Other viral diseases complicating pregnancy, second trimester: Secondary | ICD-10-CM | POA: Diagnosis not present

## 2022-02-26 DIAGNOSIS — Z3A18 18 weeks gestation of pregnancy: Secondary | ICD-10-CM | POA: Diagnosis not present

## 2022-02-26 DIAGNOSIS — B9789 Other viral agents as the cause of diseases classified elsewhere: Secondary | ICD-10-CM | POA: Diagnosis present

## 2022-02-26 DIAGNOSIS — K8020 Calculus of gallbladder without cholecystitis without obstruction: Secondary | ICD-10-CM

## 2022-02-26 DIAGNOSIS — J019 Acute sinusitis, unspecified: Secondary | ICD-10-CM | POA: Insufficient documentation

## 2022-02-26 DIAGNOSIS — Z1152 Encounter for screening for COVID-19: Secondary | ICD-10-CM | POA: Insufficient documentation

## 2022-02-26 DIAGNOSIS — Z09 Encounter for follow-up examination after completed treatment for conditions other than malignant neoplasm: Secondary | ICD-10-CM

## 2022-02-26 LAB — SARS CORONAVIRUS 2 BY RT PCR: SARS Coronavirus 2 by RT PCR: NEGATIVE

## 2022-02-26 NOTE — Discharge Instructions (Signed)
Your COVID test is negative. See handout regarding pregnancy safe medications to use.

## 2022-02-26 NOTE — Progress Notes (Signed)
02/26/2022  HPI: Felicia Curtis is a 26 y.o. female s/p robotic assisted cholecystectomy on 02/11/2022.  Patient presents today for follow-up.  She reports that she has been doing much better now.  Reports that initially she was having significant pain at the incisions but now that has been improved.  She is starting to trial different types of foods to see what settles better.  Vital signs: BP 115/77   Pulse (!) 102   Temp 97.9 F (36.6 C) (Oral)   Ht 5\' 6"  (1.676 m)   Wt 185 lb (83.9 kg)   SpO2 98%   BMI 29.86 kg/m    Physical Exam: Constitutional: No acute distress Abdomen: Soft, nondistended, nontender to palpation.  Incisions are clean, dry, intact.  Dermabond is peeling off.  Assessment/Plan: This is a 26 y.o. female s/p robotic assisted cholecystectomy.  - Discussed with patient some of the typical symptoms after cholecystectomy.  I think it is fine to start trialing different types of foods to see how her body is adjusting to not having a gallbladder.  Discussed with her still continue with 2 more weeks of no heavy lifting or pushing to allow for the wounds to heal with better strength. - Follow-up as needed or if her symptoms do not resolve over the next month.   30, MD [facility]

## 2022-02-26 NOTE — Patient Instructions (Signed)

## 2022-02-26 NOTE — ED Triage Notes (Signed)
Patient c/o sinus congestion and pressure, nasal congestion and headache that started 2 days ago.  Patient denies fevers.

## 2022-02-26 NOTE — ED Provider Notes (Signed)
[facility]    CSN: [id number] Arrival date & time: 02/26/22  0807      History   Chief Complaint Chief Complaint  Patient presents with   Sinus Problem   Headache    HPI ATAVIA POPPE is a 26 y.o. female.   HPI   Lealer presents for sinus pressure, headache and nasal drainage with nasal congestion for the past 2 days.  She is [redacted] weeks pregnant.  She has been taking over-the-counter medications without relief.  States that the last time this happened she had a hard time getting rid of it.  Discussed that she had a fever as high as 101F.  She has been taking Tylenol.  No vomiting, diarrhea, chest pain or shortness of breath. Endorses intermittent cough that she suspects is due to the drainage. Has good fetal movement, no bleeding or contractions.        Past Medical History:  Diagnosis Date   Acute appendicitis 09/20/2013   Gallstones 11/07/2019    Patient Active Problem List   Diagnosis Date Noted   Symptomatic cholelithiasis 02/11/2022   Internal hemorrhoid 01/20/2022   Amenorrhea 12/21/2021   Pain in joint of right knee 09/17/2021   Swelling of knee joint 09/17/2021   Labor and delivery indication for care or intervention 02/27/2020   SVD (spontaneous vaginal delivery) 02/27/2020   Pregnancy 02/26/2020   Gallstones 11/07/2019   Atypical chest pain 02/03/2017    Past Surgical History:  Procedure Laterality Date   APPENDECTOMY  08/29/2013   CHOLECYSTECTOMY      OB History     Gravida  2   Para  1   Term  1   Preterm      AB      Living  1      SAB      IAB      Ectopic      Multiple  0   Live Births  1            Home Medications    Prior to Admission medications   Medication Sig Start Date End Date Taking? Authorizing Provider  Prenatal Vit-Fe Fumarate-FA (PRENATAL MULTIVITAMIN) TABS tablet Take 1 tablet by mouth daily at 12 noon.   Yes [provider]  acetaminophen (TYLENOL) 500 MG tablet Take  1,000 mg by mouth every 6 (six) hours as needed for mild pain.    [provider]  acetaminophen (TYLENOL) 500 MG tablet Take 2 tablets (1,000 mg total) by mouth every 6 (six) hours as needed for mild pain. 02/11/22   Henrene Dodge, MD  calcium carbonate (TUMS - DOSED IN MG ELEMENTAL CALCIUM) 500 MG chewable tablet Chew 2 tablets by mouth as needed for indigestion or heartburn.    [provider]  ondansetron (ZOFRAN-ODT) 4 MG disintegrating tablet Place 1 tablet every 8 hours by translingual route as needed. 11/16/21   [provider]    Family History Family History  Problem Relation Age of Onset   Healthy Mother    Healthy Father     Social History Social History   Tobacco Use   Smoking status: Never    Passive exposure: Past   Smokeless tobacco: Never  Vaping Use   Vaping Use: Never used  Substance Use Topics   Alcohol use: No   Drug use: Never     Allergies   Sulfa antibiotics   Review of Systems Review of Systems: negative unless otherwise stated in HPI.  Physical Exam Triage Vital Signs ED Triage Vitals  Enc Vitals Group     BP 02/26/22 0842 104/74     Pulse Rate 02/26/22 0842 96     Resp 02/26/22 0842 14     Temp 02/26/22 0842 98.1 F (36.7 C)     Temp Source 02/26/22 0842 Oral     SpO2 02/26/22 0842 100 %     Weight 02/26/22 0838 190 lb (86.2 kg)     Height 02/26/22 0838 5\' 6"  (1.676 m)     Head Circumference --      Peak Flow --      Pain Score 02/26/22 0838 3     Pain Loc --      Pain Edu? --      Excl. in GC? --    No data found.  Updated Vital Signs BP 104/74 (BP Location: Left Arm)   Pulse 96   Temp 98.1 F (36.7 C) (Oral)   Resp 14   Ht 5\' 6"  (1.676 m)   Wt 86.2 kg   SpO2 100%   Breastfeeding No   BMI 30.67 kg/m   Visual Acuity Right Eye Distance:   Left Eye Distance:   Bilateral Distance:    Right Eye Near:   Left Eye Near:    Bilateral Near:     Physical Exam GEN:     alert, non-toxic  appearing female in no distress    HENT:  mucus membranes moist, oropharyngeal without lesions or exudate, no tonsillar hypertrophy, mild oropharyngeal erythema,  moderate erythematous edematous turbinates, clear nasal discharge, bilateral TM normal, no frontal or maxillary sinus drainage  EYES:   pupils equal and reactive, no scleral injection or discharge NECK:  normal ROM, no meningismus   RESP:  no increased work of breathing, clear to auscultation bilaterally CVS:   regular rate and rhythm ABD:   gravid appearing  Skin:   warm and dry    UC Treatments / Results  Labs (all labs ordered are listed, but only abnormal results are displayed) Labs Reviewed  SARS CORONAVIRUS 2 BY RT PCR    EKG   Radiology No results found.  Procedures Procedures (including critical care time)  Medications Ordered in UC Medications - No data to display  Initial Impression / Assessment and Plan / UC Course  I have reviewed the triage vital signs and the nursing notes.  Pertinent labs & imaging results that were available during my care of the patient were reviewed by me and considered in my medical decision making (see chart for details).       Pt is a 26 y.o. female who presents for 2 days of respiratory symptoms. Marni is afebrile here without recent antipyretics. Satting well on room air. Overall pt is non-toxic appearing, well hydrated, without respiratory distress. Pulmonary exam is unremarkable.  COVID obtained and was negative. History consistent with viral sinusitis.  Discussed symptomatic treatment. Pregnancy safe medication handout given.   Explained lack of efficacy of antibiotics in viral disease. Typical duration of symptoms discussed. Will consider antibiotics if symptoms not improving in 1 week.   Return and ED precautions given and voiced understanding. Discussed MDM, treatment plan and plan for follow-up with patient who agrees with plan.     Final Clinical Impressions(s) /  UC Diagnoses   Final diagnoses:  Acute viral sinusitis     Discharge Instructions      Your COVID test is negative. See handout regarding pregnancy safe medications to use.  ED Prescriptions   None    PDMP not reviewed this encounter.   Katha Cabal, DO 02/26/22 1115

## 2022-03-01 NOTE — L&D Delivery Note (Signed)
Delivery Note  Date of delivery: 08/03/2022 Estimated Date of Delivery: 08/01/22 No LMP recorded. Patient is pregnant. EGA: [redacted]w[redacted]d  Delivery Note At 2:27 PM a viable female was delivered via Vaginal, Spontaneous (Presentation: Left Occiput Anterior).  APGAR: 8, 9; weight pending.  Placenta status: Spontaneous, Intact.  Cord: 3 vessels with the following complications: None.    First Stage: Labor onset: unknown Augmentation : Pitocin, AROM Analgesia [name] intrapartum: Epidural AROM at 0940  Felicia Curtis presented to L&D for IOL. She was induced with pitocin and augmented with AROM. Epidural placed for pain relief.   Second Stage: Complete dilation at 1417 Onset of pushing at 1423 FHR second stage Cat I Delivery at 1427 on 08/03/2022  She progressed to complete and had a spontaneous vaginal birth of a live female over an intact perineum. The fetal head was delivered in OA position with restitution to LOA. No nuchal cord. Anterior then posterior shoulders delivered spontaneously. Baby placed on mom's abdomen and attended to by transition RN. Cord clamped and cut after 1+ mins by FOB. Cord blood obtained for newborn labs.  Third Stage: Placenta delivered intact with 3VC at 1436 Placenta disposition: routine disposal Uterine tone firm / bleeding min  IM pitocin given for hemorrhage prophylaxis  Anesthesia: Epidural Episiotomy: None Lacerations: None Suture Repair: n/a Est. Blood Loss (mL): 200  Complications: none  Mom to postpartum.  Baby to Couplet care / Skin to Skin.  Newborn: Birth Weight: pending  Apgar Scores: 8, 9 Feeding planned: breastfeeding   [name], CNM 08/03/2022 2:57 PM

## 2022-04-09 ENCOUNTER — Encounter: Payer: Self-pay | Admitting: Internal Medicine

## 2022-04-09 ENCOUNTER — Ambulatory Visit: Payer: Managed Care, Other (non HMO) | Admitting: Internal Medicine

## 2022-04-09 VITALS — BP 120/60 | HR 95 | Ht 66.0 in | Wt 189.0 lb

## 2022-04-09 DIAGNOSIS — L03011 Cellulitis of right finger: Secondary | ICD-10-CM

## 2022-04-09 MED ORDER — AMOXICILLIN 875 MG PO TABS: 875.0000 mg | ORAL_TABLET | Freq: Two times a day (BID) | ORAL | 0 refills | Status: AC

## 2022-04-09 NOTE — Progress Notes (Signed)
Established Patient Office Visit  Subjective:  Patient ID: Felicia Curtis, female    DOB: [birth year]  Age: 27 y.o. MRN: [id number]  Chief Complaint  Patient presents with   Follow-up    Finger nail infection     Patient comes in with complaints of right middle finger tip red swollen painful and tender. It started about 3 days ago.  Patient started soaking it in warm water and applying Neosporin on it.  She does not recall any injury trauma or scratch on that finger tip however she did pull hangnail from the side and since then it started developing the redness surrounding the fingernail as well as extending around the rest of the fingertip.  She does not have any fevers chills or pain in the whole hand.  There is no cellulitis of the entire finger. Patient is [redacted] weeks pregnant at this time.  Pregnancy is progressing normally without any problems.     Past Medical History:  Diagnosis Date   Acute appendicitis 09/20/2013   Gallstones 11/07/2019    Social History   Socioeconomic History   Marital status: Married    Spouse name: Abagail Kitchens   Number of children: 1   Years of education: Not on file   Highest education level: Not on file  Occupational History   Not on file  Tobacco Use   Smoking status: Never    Passive exposure: Past   Smokeless tobacco: Never  Vaping Use   Vaping Use: Never used  Substance and Sexual Activity   Alcohol use: No   Drug use: Never   Sexual activity: Not Currently    Comment: Undecided  Other Topics Concern   Not on file  Social History Narrative   Not on file   Social Determinants of Health   Financial Resource Strain: Not on file  Food Insecurity: Not on file  Transportation Needs: Not on file  Physical Activity: Not on file  Stress: Not on file  Social Connections: Not on file  Intimate Partner Violence: Not on file    Family History  Problem Relation Age of Onset   Healthy Mother    Healthy Father     Allergies  Allergen  Reactions   Sulfa Antibiotics Rash and Other (See Comments)    Review of Systems  Constitutional:  Negative for chills and malaise/fatigue.  HENT: Negative.    Eyes: Negative.   Respiratory: Negative.    Cardiovascular: Negative.   Gastrointestinal: Negative.   Musculoskeletal: Negative.   Neurological: Negative.   Psychiatric/Behavioral: Negative.         Objective:   BP 120/60   Pulse 95   Ht 5' 6"$  (1.676 m)   Wt 189 lb (85.7 kg)   SpO2 99%   BMI 30.51 kg/m   Vitals:   04/09/22 1339  BP: 120/60  Pulse: 95  Height: 5' 6"$  (1.676 m)  Weight: 189 lb (85.7 kg)  SpO2: 99%  BMI (Calculated): 30.52    Physical Exam Constitutional:      Appearance: Normal appearance.  Cardiovascular:     Rate and Rhythm: Normal rate and regular rhythm.  Pulmonary:     Effort: Pulmonary effort is normal.     Breath sounds: Normal breath sounds.  Abdominal:     General: Abdomen is flat.     Palpations: Abdomen is soft.  Musculoskeletal:     Right hand: No swelling.     Comments: Right Middle finger tip and nail area is red, swollen  and tender around the nail bed.  Neurological:     Mental Status: She is alert.    Family History  Problem Relation Age of Onset   Healthy Mother    Healthy Father     Past Surgical History:  Procedure Laterality Date   APPENDECTOMY  08/29/2013   CHOLECYSTECTOMY       No results found for any visits on 04/09/22.      Assessment & Plan:   Problem List Items Addressed This Visit   None Visit Diagnoses     Paronychia of right middle finger    -  Primary   Relevant Medications   amoxicillin (AMOXIL) 875 MG tablet       @VISPATINSTR$ @  Return in about 1 week (around 04/16/2022).   Total time spent: 20 minutes  Perrin Maltese, MD  04/09/2022

## 2022-08-03 ENCOUNTER — Encounter: Payer: Self-pay | Admitting: Obstetrics and Gynecology

## 2022-08-03 ENCOUNTER — Other Ambulatory Visit: Payer: Self-pay

## 2022-08-03 ENCOUNTER — Inpatient Hospital Stay
Admission: EM | Admit: 2022-08-03 | Discharge: 2022-08-05 | DRG: 807 | Disposition: A | Discharge status: Home / Self Care | Payer: 59 | Attending: Obstetrics | Admitting: Obstetrics

## 2022-08-03 ENCOUNTER — Inpatient Hospital Stay: Payer: 59 | Admitting: Anesthesiology

## 2022-08-03 DIAGNOSIS — O480 Post-term pregnancy: Secondary | ICD-10-CM | POA: Diagnosis present

## 2022-08-03 DIAGNOSIS — Z3A40 40 weeks gestation of pregnancy: Secondary | ICD-10-CM

## 2022-08-03 DIAGNOSIS — O99214 Obesity complicating childbirth: Secondary | ICD-10-CM | POA: Diagnosis present

## 2022-08-03 LAB — CBC
HCT: 36.3 % (ref 36.0–46.0)
Hemoglobin: 12.5 g/dL (ref 12.0–15.0)
MCH: 30 pg (ref 26.0–34.0)
MCHC: 34.4 g/dL (ref 30.0–36.0)
MCV: 87.1 fL (ref 80.0–100.0)
Platelets: 201 10*3/uL (ref 150–400)
RBC: 4.17 MIL/uL (ref 3.87–5.11)
RDW: 13.4 % (ref 11.5–15.5)
WBC: 9.4 10*3/uL (ref 4.0–10.5)
nRBC: 0 % (ref 0.0–0.2)

## 2022-08-03 LAB — RPR: RPR Ser Ql: NONREACTIVE

## 2022-08-03 LAB — TYPE AND SCREEN
ABO/RH(D): O POS
Antibody Screen: NEGATIVE

## 2022-08-03 MED ORDER — LACTATED RINGERS IV SOLN
500.0000 mL | Freq: Once | INTRAVENOUS | Status: AC
  Administered 2022-08-03: 500 mL via INTRAVENOUS

## 2022-08-03 MED ORDER — AMMONIA AROMATIC IN INHA
RESPIRATORY_TRACT | Status: AC
  Filled 2022-08-03: qty 10

## 2022-08-03 MED ORDER — MISOPROSTOL 200 MCG PO TABS
ORAL_TABLET | ORAL | Status: AC
  Filled 2022-08-03: qty 4

## 2022-08-03 MED ORDER — OXYCODONE HCL 5 MG PO TABS: 5.0000 mg | ORAL_TABLET | ORAL | Status: DC | PRN

## 2022-08-03 MED ORDER — BENZOCAINE-MENTHOL 20-0.5 % EX AERO
1.0000 | INHALATION_SPRAY | CUTANEOUS | Status: DC | PRN
  Administered 2022-08-03: 1 via TOPICAL
  Filled 2022-08-03 (×2): qty 56

## 2022-08-03 MED ORDER — WITCH HAZEL-GLYCERIN EX PADS
1.0000 | MEDICATED_PAD | CUTANEOUS | Status: DC | PRN
  Filled 2022-08-03: qty 100

## 2022-08-03 MED ORDER — ONDANSETRON HCL 4 MG/2ML IJ SOLN: 4.0000 mg | INTRAMUSCULAR | Status: DC | PRN

## 2022-08-03 MED ORDER — PHENYLEPHRINE 80 MCG/ML (10ML) SYRINGE FOR IV PUSH (FOR BLOOD PRESSURE SUPPORT): 80.0000 ug | PREFILLED_SYRINGE | INTRAVENOUS | Status: DC | PRN

## 2022-08-03 MED ORDER — FERROUS SULFATE 325 (65 FE) MG PO TABS
325.0000 mg | ORAL_TABLET | Freq: Two times a day (BID) | ORAL | Status: DC
  Administered 2022-08-04 – 2022-08-05 (×3): 325 mg via ORAL
  Filled 2022-08-03 (×3): qty 1

## 2022-08-03 MED ORDER — ACETAMINOPHEN 325 MG PO TABS: 650.0000 mg | ORAL_TABLET | ORAL | Status: DC | PRN

## 2022-08-03 MED ORDER — ONDANSETRON HCL 4 MG/2ML IJ SOLN: 4.0000 mg | Freq: Four times a day (QID) | INTRAMUSCULAR | Status: DC | PRN

## 2022-08-03 MED ORDER — DIPHENHYDRAMINE HCL 50 MG/ML IJ SOLN: 12.5000 mg | INTRAMUSCULAR | Status: DC | PRN

## 2022-08-03 MED ORDER — OXYTOCIN-SODIUM CHLORIDE 30-0.9 UT/500ML-% IV SOLN
2.5000 [IU]/h | INTRAVENOUS | Status: DC
  Filled 2022-08-03: qty 500

## 2022-08-03 MED ORDER — OXYTOCIN 10 UNIT/ML IJ SOLN
INTRAMUSCULAR | Status: AC
  Administered 2022-08-03: 10 [IU]
  Filled 2022-08-03: qty 1

## 2022-08-03 MED ORDER — LACTATED RINGERS IV SOLN: 500.0000 mL | INTRAVENOUS | Status: DC | PRN

## 2022-08-03 MED ORDER — EPHEDRINE 5 MG/ML INJ: 10.0000 mg | INTRAVENOUS | Status: DC | PRN

## 2022-08-03 MED ORDER — TETANUS-DIPHTH-ACELL PERTUSSIS 5-2.5-18.5 LF-MCG/0.5 IM SUSY: 0.5000 mL | PREFILLED_SYRINGE | Freq: Once | INTRAMUSCULAR | Status: DC

## 2022-08-03 MED ORDER — OXYTOCIN BOLUS FROM INFUSION
333.0000 mL | Freq: Once | INTRAVENOUS | Status: AC
  Administered 2022-08-03: 333 mL via INTRAVENOUS

## 2022-08-03 MED ORDER — ACETAMINOPHEN 325 MG PO TABS
650.0000 mg | ORAL_TABLET | ORAL | Status: DC | PRN
  Administered 2022-08-04 (×2): 650 mg via ORAL
  Filled 2022-08-03 (×2): qty 2

## 2022-08-03 MED ORDER — DIPHENHYDRAMINE HCL 25 MG PO CAPS: 25.0000 mg | ORAL_CAPSULE | Freq: Four times a day (QID) | ORAL | Status: DC | PRN

## 2022-08-03 MED ORDER — OXYTOCIN-SODIUM CHLORIDE 30-0.9 UT/500ML-% IV SOLN
1.0000 m[IU]/min | INTRAVENOUS | Status: DC
  Administered 2022-08-03: 2 m[IU]/min via INTRAVENOUS

## 2022-08-03 MED ORDER — DIBUCAINE (PERIANAL) 1 % EX OINT
1.0000 | TOPICAL_OINTMENT | CUTANEOUS | Status: DC | PRN
  Filled 2022-08-03: qty 28

## 2022-08-03 MED ORDER — OXYCODONE HCL 5 MG PO TABS: 10.0000 mg | ORAL_TABLET | ORAL | Status: DC | PRN

## 2022-08-03 MED ORDER — SOD CITRATE-CITRIC ACID 500-334 MG/5ML PO SOLN: 30.0000 mL | ORAL | Status: DC | PRN

## 2022-08-03 MED ORDER — FENTANYL-BUPIVACAINE-NACL 0.5-0.125-0.9 MG/250ML-% EP SOLN
12.0000 mL/h | EPIDURAL | Status: DC | PRN
  Administered 2022-08-03: 12 mL/h via EPIDURAL

## 2022-08-03 MED ORDER — FENTANYL CITRATE (PF) 100 MCG/2ML IJ SOLN: 50.0000 ug | INTRAMUSCULAR | Status: DC | PRN

## 2022-08-03 MED ORDER — FENTANYL-BUPIVACAINE-NACL 0.5-0.125-0.9 MG/250ML-% EP SOLN
EPIDURAL | Status: AC
  Filled 2022-08-03: qty 250

## 2022-08-03 MED ORDER — LACTATED RINGERS IV SOLN: INTRAVENOUS | Status: DC

## 2022-08-03 MED ORDER — LIDOCAINE-EPINEPHRINE (PF) 1.5 %-1:200000 IJ SOLN
INTRAMUSCULAR | Status: DC | PRN
  Administered 2022-08-03: 3 mL via PERINEURAL

## 2022-08-03 MED ORDER — PRENATAL MULTIVITAMIN CH
1.0000 | ORAL_TABLET | Freq: Every day | ORAL | Status: DC
  Administered 2022-08-04 – 2022-08-05 (×2): 1 via ORAL
  Filled 2022-08-03 (×2): qty 1

## 2022-08-03 MED ORDER — SENNOSIDES-DOCUSATE SODIUM 8.6-50 MG PO TABS
2.0000 | ORAL_TABLET | Freq: Every day | ORAL | Status: DC
  Administered 2022-08-04 – 2022-08-05 (×2): 2 via ORAL
  Filled 2022-08-03 (×2): qty 2

## 2022-08-03 MED ORDER — LIDOCAINE HCL (PF) 1 % IJ SOLN
INTRAMUSCULAR | Status: DC | PRN
  Administered 2022-08-03: 3 mL via SUBCUTANEOUS

## 2022-08-03 MED ORDER — COCONUT OIL OIL
1.0000 | TOPICAL_OIL | Status: DC | PRN
  Administered 2022-08-03: 1 via TOPICAL
  Filled 2022-08-03: qty 15

## 2022-08-03 MED ORDER — IBUPROFEN 600 MG PO TABS
600.0000 mg | ORAL_TABLET | Freq: Four times a day (QID) | ORAL | Status: DC
  Administered 2022-08-03 – 2022-08-05 (×6): 600 mg via ORAL
  Filled 2022-08-03 (×9): qty 1

## 2022-08-03 MED ORDER — SIMETHICONE 80 MG PO CHEW: 80.0000 mg | CHEWABLE_TABLET | ORAL | Status: DC | PRN

## 2022-08-03 MED ORDER — TERBUTALINE SULFATE 1 MG/ML IJ SOLN: 0.2500 mg | Freq: Once | INTRAMUSCULAR | Status: DC | PRN

## 2022-08-03 MED ORDER — LIDOCAINE HCL (PF) 1 % IJ SOLN
30.0000 mL | INTRAMUSCULAR | Status: DC | PRN
  Filled 2022-08-03: qty 30

## 2022-08-03 MED ORDER — BUPIVACAINE HCL (PF) 0.25 % IJ SOLN
INTRAMUSCULAR | Status: DC | PRN
  Administered 2022-08-03: 3 mL via EPIDURAL
  Administered 2022-08-03: 4 mL via EPIDURAL

## 2022-08-03 MED ORDER — ONDANSETRON HCL 4 MG PO TABS: 4.0000 mg | ORAL_TABLET | ORAL | Status: DC | PRN

## 2022-08-03 NOTE — Anesthesia Preprocedure Evaluation (Signed)
Anesthesia Evaluation  Patient identified by MRN, date of birth, ID band Patient awake    Reviewed: Allergy & Precautions, H&P , NPO status , Patient's Chart, lab work & pertinent test results  History of Anesthesia Complications Negative for: history of anesthetic complications  Airway Mallampati: II       Dental   Pulmonary           Cardiovascular (-) hypertension     Neuro/Psych negative neurological ROS     GI/Hepatic negative GI ROS, Neg liver ROS,,,  Endo/Other  negative endocrine ROS    Renal/GU negative Renal ROS  negative genitourinary   Musculoskeletal   Abdominal   Peds  Hematology negative hematology ROS (+)   Anesthesia Other Findings   Reproductive/Obstetrics (+) Pregnancy                             Anesthesia Physical Anesthesia Plan  ASA: 2  Anesthesia Plan: Epidural   Post-op Pain Management:    Induction:   PONV Risk Score and Plan:   Airway Management Planned:   Additional Equipment:   Intra-op Plan:   Post-operative Plan:   Informed Consent:   Plan Discussed with: Anesthesiologist and CRNA  Anesthesia Plan Comments:        Anesthesia Quick Evaluation

## 2022-08-03 NOTE — Anesthesia Procedure Notes (Signed)
Epidural Patient location during procedure: OB Start time: 08/03/2022 10:25 AM End time: 08/03/2022 10:31 AM  Staffing Resident/CRNA: Lynden Oxford, CRNA Performed: resident/CRNA   Preanesthetic Checklist Completed: patient identified, IV checked, site marked, risks and benefits discussed, monitors and equipment checked, pre-op evaluation and timeout performed  Epidural Patient position: sitting Prep: ChloraPrep Patient monitoring: heart rate, continuous pulse ox and blood pressure Approach: midline Location: L4-L5 Injection technique: LOR saline  Needle:  Needle type: Tuohy  Needle gauge: 18 G Needle length: 9 cm and 9 Needle insertion depth: 6 cm Catheter type: closed end flexible Catheter size: 20 Guage Catheter at skin depth: 13 cm Test dose: negative and 1.5% lidocaine with Epi 1:200 K  Assessment Events: blood not aspirated, no cerebrospinal fluid, injection not painful, no injection resistance, no paresthesia and negative IV test  Additional Notes   Patient tolerated the insertion well without complications.Reason for block:procedure for pain

## 2022-08-03 NOTE — Progress Notes (Signed)
Labor Progress Note  Felicia Curtis is a 27 y.o. G2P1001 at [redacted]w[redacted]d by LMP admitted for induction of labor due to Elective at term.  Subjective: UCs are getting stronger  Objective: BP 120/84   Pulse 76   Temp 98.6 F (37 C) (Oral)   Resp 18   Ht 5\' 6"  (1.676 m)   Wt 93 kg   BMI 33.09 kg/m   Fetal Assessment: FHT:  FHR: 135 bpm, variability: moderate,  accelerations:  Present,  decelerations:  Absent Category/reactivity:  Category I UC:   regular, every 3-4 minutes SVE:    Dilation: 4cm   Effacement: 80%  Station:  -2  Consistency: soft  Position: anterior  Membrane status: AROM at 0938 Amniotic color: MSAF  Labs: Lab Results  Component Value Date   WBC 9.4 08/03/2022   HGB 12.5 08/03/2022   HCT 36.3 08/03/2022   MCV 87.1 08/03/2022   PLT 201 08/03/2022    Assessment / Plan: Induction of labor due to term with favorable cervix,  progressing well on pitocin 0630 Pitocin started 0938 AROM Pitocin currently at 8mU  Labor: Progressing normally Preeclampsia:   120/84 Fetal Wellbeing:  Category I Pain Control:  Labor support without medications I/D:   Afebrile, GBS neg, AROM x1 hr Anticipated MOD:  NSVD  Jenifer E Evely Gainey, CNM 08/03/2022, 9:45 AM

## 2022-08-03 NOTE — H&P (Signed)
OB History & Physical   History of Present Illness:  Chief Complaint:   HPI:  Felicia Curtis is a 27 y.o. G2P1001 female at [redacted]w[redacted]d dated by LMP.  She presents to L&D for an elective induction of labor  She reports:  -active fetal movement -no leakage of fluid -no vaginal bleeding -no contractions  Pregnancy Issues: Hx gallstones - 02/11/22: Lap cholecystectomy  Obesity, BMI 31.4    Maternal Medical History:   Past Medical History:  Diagnosis Date   Acute appendicitis 09/20/2013   Gallstones 11/07/2019    Past Surgical History:  Procedure Laterality Date   APPENDECTOMY  08/29/2013   CHOLECYSTECTOMY      Allergies  Allergen Reactions   Sulfa Antibiotics Rash and Other (See Comments)    Prior to Admission medications   Medication Sig Start Date End Date Taking? Authorizing Provider  acetaminophen (TYLENOL) 500 MG tablet Take 1,000 mg by mouth every 6 (six) hours as needed for mild pain.    [provider]  acetaminophen (TYLENOL) 500 MG tablet Take 2 tablets (1,000 mg total) by mouth every 6 (six) hours as needed for mild pain. 02/11/22   Henrene Dodge, MD  calcium carbonate (TUMS - DOSED IN MG ELEMENTAL CALCIUM) 500 MG chewable tablet Chew 2 tablets by mouth as needed for indigestion or heartburn.    [provider]  ondansetron (ZOFRAN-ODT) 4 MG disintegrating tablet Place 1 tablet every 8 hours by translingual route as needed. 11/16/21   [provider]  Prenatal Vit-Fe Fumarate-FA (PRENATAL MULTIVITAMIN) TABS tablet Take 1 tablet by mouth daily at 12 noon.    [provider]     Prenatal care site: [facility]  Social History: She  reports that she has never smoked. She has been exposed to tobacco smoke. She has never used smokeless tobacco. She reports that she does not drink alcohol and does not use drugs.  Family History: family history includes Healthy in her father and mother.   Review of Systems: A full review  of systems was performed and negative except as noted in the HPI.    Physical Exam:  Vital Signs: BP 120/84   Pulse 76   Temp 98.6 F (37 C) (Oral)   Resp 18   Ht 5\' 6"  (1.676 m)   Wt 93 kg   BMI 33.09 kg/m   General:   alert and cooperative  Skin:  normal  Neurologic:    Alert & oriented x 3  Lungs:    Nl effort  Heart:   regular rate and rhythm  Abdomen:  soft, non-tender; bowel sounds normal; no masses,  no organomegaly  Extremities: : non-tender, symmetric, no edema bilaterally.      Results for orders placed or performed during the hospital encounter of 08/03/22 (from the past 24 hour(s))  CBC     Status: None   Collection Time: 08/03/22  5:44 AM  Result Value Ref Range   WBC 9.4 4.0 - 10.5 K/uL   RBC 4.17 3.87 - 5.11 MIL/uL   Hemoglobin 12.5 12.0 - 15.0 g/dL   HCT 16.1 09.6 - 04.5 %   MCV 87.1 80.0 - 100.0 fL   MCH 30.0 26.0 - 34.0 pg   MCHC 34.4 30.0 - 36.0 g/dL   RDW 40.9 81.1 - 91.4 %   Platelets 201 150 - 400 K/uL   nRBC 0.0 0.0 - 0.2 %  Type and screen [facility]     Status: None  Collection Time: 08/03/22  5:44 AM  Result Value Ref Range   ABO/RH(D) O POS    Antibody Screen NEG    Sample Expiration      08/06/2022,2359 Performed at [facility], [address] [place], [place] [address]     Pertinent Results:  Prenatal Labs: Blood type/Rh O pos  Antibody screen neg  Rubella Immune  Varicella Immune  RPR NR  HBsAg Neg  HIV NR  GC neg  Chlamydia neg  Genetic screening negative  1 hour GTT 127  3 hour GTT   GBS Neg   FHT: FHR: 135 bpm, variability: moderate,  accelerations:  Present,  decelerations:  Absent Category/reactivity:  Category I TOCO: regular, every 3-4 minutes SVE: Dilation: 4 / Effacement (%): 80 / Station: -2     Assessment:  Felicia Curtis is a 27 y.o. G66P1001 female at [redacted]w[redacted]d here for elective IOL.   Plan:  1. Admit to Labor & Delivery; consents reviewed and obtained  2. Fetal Well  being  - Fetal Tracing: cat I - GBS neg - Presentation: vtx confirmed by sve   3. Routine OB: - Prenatal labs reviewed, as above - Rh pos - CBC & T&S on admit - Clear fluids, IVF  4. Induction of Labor -  Contractions by external toco in place -  Pelvis proven to 3150g -  Plan for induction with Pitocin -  Plan for continuous fetal monitoring  -  Maternal pain control as desired: IVPM, nitrous, regional anesthesia - Anticipate vaginal delivery  5. Post Partum Planning: - Infant feeding: Breastfeeding - Contraception: POPs - Tdap: Given 06/01/22   Haroldine Laws, CNM 08/03/2022 9:45 AM

## 2022-08-03 NOTE — Discharge Summary (Shared)
Obstetrical Discharge Summary  Patient Name: Felicia Curtis DOB: 05-13-1995 MRN: [id number]  Date of Admission: 08/03/2022 Date of Delivery: 08/03/22 Delivered by: Haroldine Laws, CNM  Date of Discharge: 08/04/2022  Primary OB: [facility] LMP:No LMP recorded. EDC Estimated Date of Delivery: 08/01/22 Gestational Age at Delivery: [redacted]w[redacted]d   Antepartum complications:  Hx gallstones - 02/11/22: Lap cholecystectomy  Obesity, BMI 31.4  Admitting Diagnosis: Normal labor and delivery [O80]  Secondary Diagnosis: Patient Active Problem List   Diagnosis Date Noted   Normal labor and delivery 08/03/2022   NSVD (normal spontaneous vaginal delivery) 08/03/2022   Symptomatic cholelithiasis 02/11/2022   Internal hemorrhoid 01/20/2022   Amenorrhea 12/21/2021   Encounter for supervision of other normal pregnancy, second trimester 12/21/2021   Pain in joint of right knee 09/17/2021   Swelling of knee joint 09/17/2021   Labor and delivery indication for care or intervention 02/27/2020   SVD (spontaneous vaginal delivery) 02/27/2020   Pregnancy 02/26/2020   Gallstones 11/07/2019   Atypical chest pain 02/03/2017    Discharge Diagnosis: Term Pregnancy Delivered      Induction: AROM and Pitocin Complications: None Intrapartum complications/course: see delivery note Delivery Type: spontaneous vaginal delivery Anesthesia: epidural anesthesia Placenta: spontaneous To Pathology: No  Laceration: none Episiotomy: none Newborn Data: Live born female  Birth Weight:  6lb 5.2oz APGAR: 8, 9  Newborn Delivery   Birth date/time: 08/03/2022 14:27:00 Delivery type: Vaginal, Spontaneous      Postpartum Procedures: none Edinburgh:     08/04/2022    3:00 AM 04/03/2020    2:18 PM 02/28/2020    8:54 AM  Edinburgh Postnatal Depression Scale Screening Tool  I have been able to laugh and see the funny side of things. 0 0 0  I have looked forward with enjoyment to things. 0 0 0  I have blamed myself  unnecessarily when things went wrong. 0 1 1  I have been anxious or worried for no good reason. 0 1 1  I have felt scared or panicky for no good reason. 0 0 0  Things have been getting on top of me. 0 0 0  I have been so unhappy that I have had difficulty sleeping. 0 0 0  I have felt sad or miserable. 0 0 0  I have been so unhappy that I have been crying. 0 0 0  The thought of harming myself has occurred to me. 0 0 0  Edinburgh Postnatal Depression Scale Total 0 2 2     Post partum course:   Patient had an uncomplicated postpartum course.  By time of discharge on PPD#1, her pain was controlled on oral pain medications; she had appropriate lochia and was ambulating, voiding without difficulty and tolerating regular diet.  She was deemed stable for discharge to home.    Discharge Physical Exam:  BP 113/84 (BP Location: Right Arm)   Pulse 78   Temp (!) 97.4 F (36.3 C) (Oral)   Resp 20   Ht 5\' 6"  (1.676 m)   Wt 93 kg   SpO2 100%   Breastfeeding Unknown   BMI 33.09 kg/m   General: NAD CV: RRR Pulm: CTABL, nl effort ABD: s/nd/nt, fundus firm and below the umbilicus Lochia: moderate Perineum: minimal edema/intact DVT Evaluation: LE non-ttp, no evidence of DVT on exam.  Hemoglobin  Date Value Ref Range Status  08/04/2022 11.7 (L) 12.0 - 15.0 g/dL Final  27/25/3664 40.3 11.1 - 15.9 g/dL Final   HCT  Date Value Ref  Range Status  08/04/2022 34.3 (L) 36.0 - 46.0 % Final   Hematocrit  Date Value Ref Range Status  09/25/2020 39.5 34.0 - 46.6 % Final    Risk assessment for postpartum VTE and prophylactic treatment: Very high risk factors: None High risk factors: None Moderate risk factors: BMI 30-40 kg/m2  Postpartum VTE prophylaxis with LMWH not indicated  Disposition: stable, discharge to home. Baby Feeding: breast feeding Baby Disposition: home with mom  Rh Immune globulin indicated: No Rubella vaccine given: was not indicated Varivax vaccine given: was not  indicated Flu vaccine given in AP setting: n/a Tdap vaccine given in AP setting: Yes   Contraception: oral progesterone-only contraceptive  Prenatal Labs:  Blood type/Rh O pos  Antibody screen neg  Rubella Immune  Varicella Immune  RPR NR  HBsAg Neg  HIV NR  GC neg  Chlamydia neg  Genetic screening negative  1 hour GTT 127  3 hour GTT    GBS Neg    Plan:  Felicia Curtis was discharged to home in good condition. Follow-up appointment with delivering provider in 6 weeks.   Discharge Medications: Allergies as of 08/04/2022       Reactions   Sulfa Antibiotics Rash, Other (See Comments)        Medication List     TAKE these medications    acetaminophen 500 MG tablet Commonly known as: TYLENOL Take 1,000 mg by mouth every 6 (six) hours as needed for mild pain. What changed: Another medication with the same name was removed. Continue taking this medication, and follow the directions you see here.   benzocaine-Menthol 20-0.5 % Aero Commonly known as: DERMOPLAST Apply 1 Application topically as needed for irritation (perineal discomfort).   calcium carbonate 500 MG chewable tablet Commonly known as: TUMS - dosed in mg elemental calcium Chew 2 tablets by mouth as needed for indigestion or heartburn.   ferrous sulfate 325 (65 FE) MG tablet Take 1 tablet (325 mg total) by mouth 2 (two) times daily with a meal.   ibuprofen 600 MG tablet Commonly known as: ADVIL Take 1 tablet (600 mg total) by mouth every 6 (six) hours as needed for fever, mild pain or cramping.   ondansetron 4 MG disintegrating tablet Commonly known as: ZOFRAN-ODT Place 1 tablet every 8 hours by translingual route as needed.   prenatal multivitamin Tabs tablet Take 1 tablet by mouth daily at 12 noon.   witch hazel-glycerin pad Commonly known as: TUCKS Apply 1 Application topically as needed for hemorrhoids.         Follow-up Information     Haroldine Laws, CNM. Schedule an appointment  as soon as possible for a visit in 6 week(s).   Specialty: Certified Nurse Midwife Contact information: [address] [place] [address] [phone]                 Signed:  Blanchard Kelch 08/04/2022 9:04 AM

## 2022-08-04 LAB — CBC
HCT: 34.3 % — ABNORMAL LOW (ref 36.0–46.0)
Hemoglobin: 11.7 g/dL — ABNORMAL LOW (ref 12.0–15.0)
MCH: 30.2 pg (ref 26.0–34.0)
MCHC: 34.1 g/dL (ref 30.0–36.0)
MCV: 88.4 fL (ref 80.0–100.0)
Platelets: 161 10*3/uL (ref 150–400)
RBC: 3.88 MIL/uL (ref 3.87–5.11)
RDW: 13.7 % (ref 11.5–15.5)
WBC: 11.3 10*3/uL — ABNORMAL HIGH (ref 4.0–10.5)
nRBC: 0 % (ref 0.0–0.2)

## 2022-08-04 MED ORDER — FERROUS SULFATE 325 (65 FE) MG PO TABS: 325.0000 mg | ORAL_TABLET | Freq: Two times a day (BID) | ORAL | 3 refills | Status: AC

## 2022-08-04 MED ORDER — WITCH HAZEL-GLYCERIN EX PADS: 1.0000 | MEDICATED_PAD | CUTANEOUS | 0 refills | Status: AC | PRN

## 2022-08-04 MED ORDER — BENZOCAINE-MENTHOL 20-0.5 % EX AERO: 1.0000 | INHALATION_SPRAY | CUTANEOUS | 0 refills | Status: AC | PRN

## 2022-08-04 MED ORDER — IBUPROFEN 600 MG PO TABS: 600.0000 mg | ORAL_TABLET | Freq: Four times a day (QID) | ORAL | 0 refills | Status: AC | PRN

## 2022-08-04 NOTE — Progress Notes (Signed)
Post Partum Day 1 Subjective: Doing well, no complaints.  Tolerating regular diet, pain with PO meds, voiding and ambulating without difficulty.  No CP SOB Fever,Chills, N/V or leg pain; denies nipple or breast pain, no HA change of vision, RUQ/epigastric pain  Objective: BP 113/80 (BP Location: Right Arm)   Pulse 70   Temp 98 F (36.7 C) (Oral)   Resp 18   Ht 5\' 6"  (1.676 m)   Wt 93 kg   SpO2 100%   Breastfeeding Unknown   BMI 33.09 kg/m    Physical Exam:  General: NAD Breasts: soft/nontender CV: RRR Pulm: nl effort, CTABL Abdomen: soft, NT, BS x 4 Perineum: minimal edema, intact Uterine Fundus: fundus firm and 1 fb below umbilicus DVT Evaluation: no cords, ttp LEs   Recent Labs    08/03/22 0544 08/04/22 0616  HGB 12.5 11.7*  HCT 36.3 34.3*  WBC 9.4 11.3*  PLT 201 161    Assessment/Plan: 27 y.o. G2P2002 postpartum day # 1  - Continue routine PP care, infant will be under bili lights - Lactation consult PRN  Disposition: Desired d/c home today but infant to stay inpatient overnight so she will stay inpatient with the infant.  Janyce Llanos, CNM 08/04/2022 5:29 PM

## 2022-08-04 NOTE — Discharge Summary (Signed)
Obstetrical Discharge Summary  Patient Name: Felicia Curtis DOB: February 04, 1996 MRN: [id number]  Date of Admission: 08/03/2022 Date of Delivery: 08/03/22 Delivered by: Haroldine Laws, CNM  Date of Discharge: 08/05/2022  Primary OB: [facility] LMP:No LMP recorded. EDC Estimated Date of Delivery: 08/01/22 Gestational Age at Delivery: [redacted]w[redacted]d   Antepartum complications:  Hx gallstones - 02/11/22: Lap cholecystectomy  Obesity, BMI 31.4  Admitting Diagnosis: Normal labor and delivery [O80]  Secondary Diagnosis: Patient Active Problem List   Diagnosis Date Noted   Normal labor and delivery 08/03/2022   NSVD (normal spontaneous vaginal delivery) 08/03/2022   Symptomatic cholelithiasis 02/11/2022   Internal hemorrhoid 01/20/2022   Amenorrhea 12/21/2021   Encounter for supervision of other normal pregnancy, second trimester 12/21/2021   Pain in joint of right knee 09/17/2021   Swelling of knee joint 09/17/2021   Labor and delivery indication for care or intervention 02/27/2020   SVD (spontaneous vaginal delivery) 02/27/2020   Pregnancy 02/26/2020   Gallstones 11/07/2019   Atypical chest pain 02/03/2017    Discharge Diagnosis: Term Pregnancy Delivered      Induction: AROM and Pitocin Complications: None Intrapartum complications/course: see delivery note Delivery Type: spontaneous vaginal delivery Anesthesia: epidural anesthesia Placenta: spontaneous To Pathology: No  Laceration: none Episiotomy: none Newborn Data: Live born female  Birth Weight:  6lb 5.2oz APGAR: 8, 9  Newborn Delivery   Birth date/time: 08/03/2022 14:27:00 Delivery type: Vaginal, Spontaneous      Postpartum Procedures: none Edinburgh:     08/04/2022    3:00 AM 04/03/2020    2:18 PM 02/28/2020    8:54 AM  Edinburgh Postnatal Depression Scale Screening Tool  I have been able to laugh and see the funny side of things. 0 0 0  I have looked forward with enjoyment to things. 0 0 0  I have blamed myself  unnecessarily when things went wrong. 0 1 1  I have been anxious or worried for no good reason. 0 1 1  I have felt scared or panicky for no good reason. 0 0 0  Things have been getting on top of me. 0 0 0  I have been so unhappy that I have had difficulty sleeping. 0 0 0  I have felt sad or miserable. 0 0 0  I have been so unhappy that I have been crying. 0 0 0  The thought of harming myself has occurred to me. 0 0 0  Edinburgh Postnatal Depression Scale Total 0 2 2     Post partum course:   Patient had an uncomplicated postpartum course.  By time of discharge on PPD#1, her pain was controlled on oral pain medications; she had appropriate lochia and was ambulating, voiding without difficulty and tolerating regular diet.  She was deemed stable for discharge to home.    Discharge Physical Exam:  BP 106/70 (BP Location: Right Arm)   Pulse 80   Temp 98.1 F (36.7 C) (Oral)   Resp 18   Ht 5\' 6"  (1.676 m)   Wt 93 kg   SpO2 97%   Breastfeeding Unknown   BMI 33.09 kg/m   General: NAD CV: RRR Pulm: CTABL, nl effort ABD: s/nd/nt, fundus firm and below the umbilicus Lochia: moderate Perineum: minimal edema/intact DVT Evaluation: LE non-ttp, no evidence of DVT on exam.  Hemoglobin  Date Value Ref Range Status  08/04/2022 11.7 (L) 12.0 - 15.0 g/dL Final  40/98/1191 47.8 11.1 - 15.9 g/dL Final   HCT  Date Value Ref Range  Status  08/04/2022 34.3 (L) 36.0 - 46.0 % Final   Hematocrit  Date Value Ref Range Status  09/25/2020 39.5 34.0 - 46.6 % Final    Risk assessment for postpartum VTE and prophylactic treatment: Very high risk factors: None High risk factors: None Moderate risk factors: BMI 30-40 kg/m2  Postpartum VTE prophylaxis with LMWH not indicated  Disposition: stable, discharge to home. Baby Feeding: breast feeding Baby Disposition: home with mom  Rh Immune globulin indicated: No Rubella vaccine given: was not indicated Varivax vaccine given: was not  indicated Flu vaccine given in AP setting: n/a Tdap vaccine given in AP setting: Yes   Contraception: oral progesterone-only contraceptive  Prenatal Labs:  Blood type/Rh O pos  Antibody screen neg  Rubella Immune  Varicella Immune  RPR NR  HBsAg Neg  HIV NR  GC neg  Chlamydia neg  Genetic screening negative  1 hour GTT 127  3 hour GTT    GBS Neg    Plan:  Felicia Curtis was discharged to home in good condition. Follow-up appointment with delivering provider in 6 weeks.   Discharge Medications: Allergies as of 08/05/2022       Reactions   Sulfa Antibiotics Rash, Other (See Comments)        Medication List     TAKE these medications    acetaminophen 500 MG tablet Commonly known as: TYLENOL Take 1,000 mg by mouth every 6 (six) hours as needed for mild pain. What changed: Another medication with the same name was removed. Continue taking this medication, and follow the directions you see here.   benzocaine-Menthol 20-0.5 % Aero Commonly known as: DERMOPLAST Apply 1 Application topically as needed for irritation (perineal discomfort).   calcium carbonate 500 MG chewable tablet Commonly known as: TUMS - dosed in mg elemental calcium Chew 2 tablets by mouth as needed for indigestion or heartburn.   ferrous sulfate 325 (65 FE) MG tablet Take 1 tablet (325 mg total) by mouth 2 (two) times daily with a meal.   ibuprofen 600 MG tablet Commonly known as: ADVIL Take 1 tablet (600 mg total) by mouth every 6 (six) hours as needed for fever, mild pain or cramping.   ondansetron 4 MG disintegrating tablet Commonly known as: ZOFRAN-ODT Place 1 tablet every 8 hours by translingual route as needed.   prenatal multivitamin Tabs tablet Take 1 tablet by mouth daily at 12 noon.   witch hazel-glycerin pad Commonly known as: TUCKS Apply 1 Application topically as needed for hemorrhoids.         Follow-up Information     Haroldine Laws, CNM. Schedule an appointment  as soon as possible for a visit in 6 week(s).   Specialty: Certified Nurse Midwife Contact information: [address] [place] [address] [phone]                 Signed:  Gregery Na 08/05/2022 9:06 AM

## 2022-08-04 NOTE — Lactation Note (Addendum)
This note was copied from a baby's chart. Lactation Consultation Note  Patient Name: Girl Felicia Curtis [id number] Date: 08/04/2022 Age:27 Reason for consult: Initial assessment;Term   Maternal Data This is mom's 2nd baby,SVD.   On initial visit mom reports her nipple are sore, baby latches to the right side and not really to the left side. Per mom she has requested formula bottles as she also plans to use bottle feeds so dad can help out. Mom reports she has a pump and is planning to try pumping when she gets home. For now mom declines assistance with breastfeeding. Per mom she may want to initiate pumping while in the hospital. For now mom reports she prefers for the baby to bottle feed. Per dad he has attempted to bottle feed baby and baby is to sleepy to feed. He reports baby's last feeding was a little more than 3 hours ago and at that time the baby took 5 ml's. Baby took 1 ml of formula by bottle at the recent attempt. Does the patient have breastfeeding experience prior to this delivery?: Yes How long did the patient breastfeed?: Brief experience with her first baby. Per mom she did not continue breastfeeding after leaving the hospital.  Feeding Mother's Current Feeding Choice: Breast Milk and Formula Mom's plan is now to formula feed and she is thinking she may pump and also provide breastmilk via bottle. Discussed with parents strategies to wake the baby to feed and recommended taking the baby out of her clothes and changing the diaper. Care nurse to assist parents with bottle feeding.  Lactation Tools Discussed/Used  Discussed use and rationale of initiating DEBP in the early post partum period to establish mom's milk supply. Mom will call LC if she would like assistance in pump initiation. LC number on white board.  Mom called LC to assist with initiating pumping. Mom pumped at 1545 pm. She did not obtain any colostrum at this time. Support and encouragement provided. Reviewed  collection, and cleaning of parts, milk storage guidelines.  Interventions Interventions: Education   Discharge Pump: Personal (mom has a Spectra pump and a [employer]ow hands free pump.)  Consult Status Consult Status: Follow-up Date: 08/05/22 Follow-up type: In-patient  Update provided to care nurse.  Fuller Song 08/04/2022, 2:05 PM

## 2022-08-05 MED ORDER — POLYETHYLENE GLYCOL 3350 17 G PO PACK
17.0000 g | PACK | Freq: Every day | ORAL | Status: DC
  Administered 2022-08-05: 17 g via ORAL
  Filled 2022-08-05 (×2): qty 1

## 2022-08-05 NOTE — Progress Notes (Signed)
Patient discharged home with infant. Discharge instructions and prescriptions given and reviewed with patient. Patient verbalized understanding. Escorted out by auxillary.  

## 2022-08-05 NOTE — Discharge Instructions (Signed)
Discharge Instructions:   If there are any new medications, they have been ordered and will be available for pickup at the listed pharmacy on your way home from the hospital.   Call office if you have any of the following: headache, visual changes, fever >101.0 F, chills, shortness of breath, breast concerns, excessive vaginal bleeding, incision drainage or problems, leg pain or redness, depression or any other concerns. If you have vaginal discharge with an odor, let your doctor know.   It is normal to bleed for up to 6 weeks. You should not soak through more than 1 pad in 1 hour. If you have a blood clot larger than your fist with continued bleeding, call your doctor.   Activity: Do not lift > 10 lbs for 6 weeks (do not lift anything heavier than your baby). No intercourse, tampons, swimming pools, hot tubs, baths (only showers) for 6 weeks.  No driving for 1-2 weeks. Continue prenatal vitamin, especially if breastfeeding. Increase calories and fluids (water) while breastfeeding.   Your milk will come in, in the next couple of days (right now it is colostrum). You may have a slight fever when your milk comes in, but it should go away on its own.  If it does not, and rises above 101 F please call the doctor. You will also feel achy and your breasts will be firm. They will also start to leak. If you are breastfeeding, continue as you have been and you can pump/express milk for comfort.   If you have too much milk, your breasts can become engorged, which could lead to mastitis. This is an infection of the milk ducts. It can be very painful and you will need to notify your doctor to obtain a prescription for antibiotics. You can also treat it with a shower or hot/cold compress.   For concerns about your baby, please call your pediatrician.  For breastfeeding concerns, the lactation consultant can be reached at [phone].   Postpartum blues (feelings of happy one minute and sad another minute)  are normal for the first few weeks but if it gets worse let your doctor know.   Congratulations! We enjoyed caring for you and your new bundle of joy!  

## 2022-08-05 NOTE — Anesthesia Postprocedure Evaluation (Signed)
Anesthesia Post Note  Patient: [name]  Procedure(s) Performed: AN AD HOC LABOR EPIDURAL  Patient location during evaluation: Mother Baby Anesthesia Type: Epidural Level of consciousness: awake and alert Pain management: pain level controlled Vital Signs Assessment: post-procedure vital signs reviewed and stable Respiratory status: spontaneous breathing, nonlabored ventilation and respiratory function stable Cardiovascular status: stable Postop Assessment: no headache, no backache and epidural receding Anesthetic complications: no   No notable events documented.   Last Vitals:  Vitals:   08/04/22 1624 08/04/22 2309  BP: 113/80 109/68  Pulse: 70 77  Resp: 18 18  Temp: 36.7 C 36.9 C  SpO2:  100%    Last Pain:  Vitals:   08/04/22 2326  TempSrc:   PainSc: 0-No pain                 Mehmet Scally Lawerance Cruel

## 2022-08-05 NOTE — Lactation Note (Signed)
This note was copied from a baby's chart. Lactation Consultation Note  Patient Name: Felicia Curtis Date: 08/05/2022 Age:27 hours Reason for consult: Follow-up assessment;Hyperbilirubinemia;RN request   Maternal Data    Feeding Mother's Current Feeding Choice: Breast Milk and Formula Nipple Type: Slow - flow  LATCH Score                    Lactation Tools Discussed/Used    Interventions Interventions: Breast feeding basics reviewed;LC Services brochure Bililights just discontinued. Mom prefers to pump and bottle feed breast milk and formula for now. Pump use for 15 minutes every 3 hours encouraged. Mom reports using 24mm on right and 27mm flange on left. Coconut oil on flange for comfort as needed discussed. Milk storage guidelines and pump cleaning recommendations reviewed. LC number on whiteboard. RN updated about current feeding plan. Mom and support person notified that milk supply should increase in 2-5 days with routine breast stimulation from pump or baby and that [facility] outpatient services are available for supply and latch issues or questions. Engorgement and ice for relief discussed.   Discharge Discharge Education: Engorgement and breast care;Outpatient recommendation (pump use q3hr, cleaning and milk storage guidelines)  Consult Status      Matt Holmes 08/05/2022, 9:38 AM

## 2022-08-08 ENCOUNTER — Emergency Department
Admission: EM | Admit: 2022-08-08 | Discharge: 2022-08-08 | Disposition: A | Discharge status: Home / Self Care | Payer: 59 | Attending: Emergency Medicine | Admitting: Emergency Medicine

## 2022-08-08 ENCOUNTER — Other Ambulatory Visit: Payer: Self-pay

## 2022-08-08 ENCOUNTER — Emergency Department: Payer: 59

## 2022-08-08 DIAGNOSIS — N3000 Acute cystitis without hematuria: Secondary | ICD-10-CM

## 2022-08-08 DIAGNOSIS — O9229 Other disorders of breast associated with pregnancy and the puerperium: Secondary | ICD-10-CM | POA: Diagnosis present

## 2022-08-08 DIAGNOSIS — Z1152 Encounter for screening for COVID-19: Secondary | ICD-10-CM | POA: Diagnosis not present

## 2022-08-08 DIAGNOSIS — O9122 Nonpurulent mastitis associated with the puerperium: Secondary | ICD-10-CM | POA: Diagnosis not present

## 2022-08-08 DIAGNOSIS — N61 Mastitis without abscess: Secondary | ICD-10-CM

## 2022-08-08 LAB — COMPREHENSIVE METABOLIC PANEL
ALT: 24 U/L (ref 0–44)
AST: 26 U/L (ref 15–41)
Albumin: 3.3 g/dL — ABNORMAL LOW (ref 3.5–5.0)
Alkaline Phosphatase: 121 U/L (ref 38–126)
Anion gap: 10 (ref 5–15)
BUN: 13 mg/dL (ref 6–20)
CO2: 22 mmol/L (ref 22–32)
Calcium: 8.4 mg/dL — ABNORMAL LOW (ref 8.9–10.3)
Chloride: 104 mmol/L (ref 98–111)
Creatinine, Ser: 0.56 mg/dL (ref 0.44–1.00)
GFR, Estimated: >60 mL/min (ref >60)
Glucose, Bld: 87 mg/dL (ref 70–99)
Potassium: 4 mmol/L (ref 3.5–5.1)
Sodium: 136 mmol/L (ref 135–145)
Total Bilirubin: 0.6 mg/dL (ref 0.3–1.2)
Total Protein: 6.8 g/dL (ref 6.5–8.1)

## 2022-08-08 LAB — CBC WITH DIFFERENTIAL/PLATELET
Abs Immature Granulocytes: 0.05 10*3/uL (ref 0.00–0.07)
Basophils Absolute: 0 10*3/uL (ref 0.0–0.1)
Basophils Relative: 0 %
Eosinophils Absolute: 0.1 10*3/uL (ref 0.0–0.5)
Eosinophils Relative: 1 %
HCT: 34.4 % — ABNORMAL LOW (ref 36.0–46.0)
Hemoglobin: 11.5 g/dL — ABNORMAL LOW (ref 12.0–15.0)
Immature Granulocytes: 1 %
Lymphocytes Relative: 13 %
Lymphs Abs: 1.3 10*3/uL (ref 0.7–4.0)
MCH: 30 pg (ref 26.0–34.0)
MCHC: 33.4 g/dL (ref 30.0–36.0)
MCV: 89.8 fL (ref 80.0–100.0)
Monocytes Absolute: 0.4 10*3/uL (ref 0.1–1.0)
Monocytes Relative: 4 %
Neutro Abs: 8.1 10*3/uL — ABNORMAL HIGH (ref 1.7–7.7)
Neutrophils Relative %: 81 %
Platelets: 190 10*3/uL (ref 150–400)
RBC: 3.83 MIL/uL — ABNORMAL LOW (ref 3.87–5.11)
RDW: 13.4 % (ref 11.5–15.5)
WBC: 9.9 10*3/uL (ref 4.0–10.5)
nRBC: 0 % (ref 0.0–0.2)

## 2022-08-08 LAB — URINALYSIS, W/ REFLEX TO CULTURE (INFECTION SUSPECTED)
Bilirubin Urine: NEGATIVE
Glucose, UA: NEGATIVE mg/dL
Ketones, ur: NEGATIVE mg/dL
Nitrite: NEGATIVE
Protein, ur: 100 mg/dL — AB
Specific Gravity, Urine: 1.02 (ref 1.005–1.030)
WBC, UA: >50 WBC/hpf (ref 0–5)
pH: 6 (ref 5.0–8.0)

## 2022-08-08 LAB — D-DIMER, QUANTITATIVE: D-Dimer, Quant: 3.26 ug/mL-FEU — ABNORMAL HIGH (ref 0.00–0.50)

## 2022-08-08 LAB — SARS CORONAVIRUS 2 BY RT PCR: SARS Coronavirus 2 by RT PCR: NEGATIVE

## 2022-08-08 MED ORDER — ACETAMINOPHEN 325 MG PO TABS
650.0000 mg | ORAL_TABLET | Freq: Once | ORAL | Status: AC
  Administered 2022-08-08: 650 mg via ORAL
  Filled 2022-08-08: qty 2

## 2022-08-08 MED ORDER — IBUPROFEN 600 MG PO TABS
600.0000 mg | ORAL_TABLET | Freq: Once | ORAL | Status: AC
  Administered 2022-08-08: 600 mg via ORAL
  Filled 2022-08-08: qty 1

## 2022-08-08 MED ORDER — SODIUM CHLORIDE 0.9 % IV BOLUS
1000.0000 mL | Freq: Once | INTRAVENOUS | Status: AC
  Administered 2022-08-08: 1000 mL via INTRAVENOUS

## 2022-08-08 MED ORDER — CEPHALEXIN 500 MG PO CAPS
500.0000 mg | ORAL_CAPSULE | Freq: Once | ORAL | Status: AC
  Administered 2022-08-08: 500 mg via ORAL
  Filled 2022-08-08: qty 1

## 2022-08-08 MED ORDER — CEPHALEXIN 500 MG PO CAPS: 500.0000 mg | ORAL_CAPSULE | Freq: Four times a day (QID) | ORAL | 0 refills | Status: AC

## 2022-08-08 NOTE — ED Triage Notes (Addendum)
Pt to ed from home via POV for fever and chills. Pt is post partum x 5 days and delivered vaginally with no complications. Pt is caox4, in no acute distress. Pt denies any N/V. Pt just took last dose of tylenol at 715pm.

## 2022-08-08 NOTE — Discharge Instructions (Addendum)
Take Keflex antibiotic 4 times daily for 10 days as prescribed.  This antibiotic will cover both your urinary tract infection as well mastitis.  Take acetaminophen 650 mg and ibuprofen 400 mg every 6 hours for pain.  Take with food.  Call [facility] gynecology clinic for reevaluation appointment tomorrow.  As we discussed, if you experience any new, worsening, unexpected symptoms, call your doctor right away or come back to the emergency department for a reevaluation

## 2022-08-08 NOTE — ED Provider Notes (Signed)
[facility] Provider Note    Event Date/Time   First MD Initiated Contact with Patient 08/08/22 2047     (approximate)   History   No chief complaint on file.   HPI  Felicia Curtis is a 27 y.o. female   Past medical history of recent vaginal delivery at full-term 5 days ago, who presents with fever, chills myalgias, and bilateral breast pain left greater than right.  She is not breast-feeding or pumping and 3 days ago developed soreness hardness in her left breast with overlying redness and enlarged lymph nodes in the left axilla.  Along with this she developed fevers and chills.  She is not breast-feeding nor pumping.  She denies any urinary symptoms.  She denies any significant vaginal discharge or abdominal pain.  She denies any respiratory infectious symptoms.  She has no chest pain shortness of breath or leg pain/swelling unilaterally and has no history of blood clot.  Independent Historian contributed to assessment above: husband at bedside corroborates information given above       Physical Exam   Triage Vital Signs: ED Triage Vitals  Enc Vitals Group     BP 08/08/22 1948 119/84     Pulse Rate 08/08/22 1948 (!) 110     Resp 08/08/22 1948 16     Temp 08/08/22 1948 (!) 100.6 F (38.1 C)     Temp Source 08/08/22 1948 Oral     SpO2 08/08/22 1948 98 %     Weight 08/08/22 1949 202 lb 13.2 oz (92 kg)     Height 08/08/22 1949 5\' 6"  (1.676 m)     Head Circumference --      Peak Flow --      Pain Score 08/08/22 1949 0     Pain Loc --      Pain Edu? --      Excl. in GC? --     Most recent vital signs: Vitals:   08/08/22 1948 08/08/22 2138  BP: 119/84 136/78  Pulse: (!) 110 (!) 107  Resp: 16 18  Temp: (!) 100.6 F (38.1 C)   SpO2: 98% 99%    General: Awake, no distress.  CV:  Good peripheral perfusion.  Resp:  Normal effort.  Abd:  No distention.  Other:  Clear lungs, febrile 100.6, mild tachycardia in the low 100s, normotensive,  no respiratory distress, no hypoxemia.  Soft nontender abdomen.  Oropharynx appears normal without lesions or exudates and neck is supple with full range of motion.  Her bilateral breasts are indurated tender to palpation with some enlarged lymph nodes in the left axilla.  There are no overlying skin changes or cellulitis noted patient states that redness of the left breast consistent yesterday got better today.   ED Results / Procedures / Treatments   Labs (all labs ordered are listed, but only abnormal results are displayed) Labs Reviewed  CBC WITH DIFFERENTIAL/PLATELET - Abnormal; Notable for the following components:      Result Value   RBC 3.83 (*)    Hemoglobin 11.5 (*)    HCT 34.4 (*)    Neutro Abs 8.1 (*)    All other components within normal limits  COMPREHENSIVE METABOLIC PANEL - Abnormal; Notable for the following components:   Calcium 8.4 (*)    Albumin 3.3 (*)    All other components within normal limits  D-DIMER, QUANTITATIVE - Abnormal; Notable for the following components:   D-Dimer, Quant 3.26 (*)    All  other components within normal limits  URINALYSIS, W/ REFLEX TO CULTURE (INFECTION SUSPECTED) - Abnormal; Notable for the following components:   Color, Urine YELLOW (*)    APPearance CLOUDY (*)    Hgb urine dipstick LARGE (*)    Protein, ur 100 (*)    Leukocytes,Ua LARGE (*)    Bacteria, UA RARE (*)    All other components within normal limits  SARS CORONAVIRUS 2 BY RT PCR  URINE CULTURE     I ordered and reviewed the above labs they are notable for white blood cell count is normal.   RADIOLOGY I independently reviewed and interpreted chest x-ray and I see no obvious focalities or pneumothorax   PROCEDURES:  Critical Care performed: No  Procedures   MEDICATIONS ORDERED IN ED: Medications  cephALEXin (KEFLEX) capsule 500 mg (has no administration in time range)  acetaminophen (TYLENOL) tablet 650 mg (650 mg Oral Given 08/08/22 2103)  ibuprofen  (ADVIL) tablet 600 mg (600 mg Oral Given 08/08/22 2103)  sodium chloride 0.9 % bolus 1,000 mL (1,000 mLs Intravenous New Bag/Given 08/08/22 2104)     IMPRESSION / MDM / ASSESSMENT AND PLAN / ED COURSE  I reviewed the triage vital signs and the nursing notes.                                Patient's presentation is most consistent with acute presentation with potential threat to life or bodily function.  Differential diagnosis includes, but is not limited to, mastitis, pneumonia, respiratory infection, urinary tract infection, endometritis, intra-abdominal infection, meningitis sepsis    The patient is on the cardiac monitor to evaluate for evidence of arrhythmia and/or significant heart rate changes.  MDM:    Most likely mastitis is her source given her breast symptoms fevers and chills and exam as above.  No respiratory infectious symptoms, denying abdominal exam, without other sources at this time.  Urinalysis is pending no fevers no urinary symptoms and her Keflex for mastitis will cover for urinary tract infection in any case.  A D-dimer was ordered at triage but I do not think that the elevated D-dimer is particularly to the evaluation today given very low clinical suspicion for VTE.  Treat mastitis with Keflex antibiotic and she will follow-up with her [facility] gynecology clinic tomorrow.  She understands to return to the emergency department for any new or worsening symptoms.         FINAL CLINICAL IMPRESSION(S) / ED DIAGNOSES   Final diagnoses:  Mastitis     Rx / DC Orders   ED Discharge Orders          Ordered    cephALEXin (KEFLEX) 500 MG capsule  4 times daily        08/08/22 2140             Note:  This document was prepared using Dragon voice recognition software and may include unintentional dictation errors.    Pilar Jarvis, MD 08/08/22 2145

## 2022-08-10 LAB — URINE CULTURE

## 2022-08-11 LAB — URINE CULTURE: Culture: >=100000 — AB

## 2022-09-15 ENCOUNTER — Telehealth: Payer: Self-pay

## 2022-09-15 NOTE — Telephone Encounter (Signed)
[facility]- Discharge Call Backs-Spoke to patient on the phone about the following below. 1-Do you have any questions or concerns about yourself as you heal?  No 2-Any concerns or questions about your baby? No 3- Reviewed ABC's of safe sleep. 4-How was your stay at the hospital?Good 5- Did our team work together to care for you?Yes You should be receiving a survey in the mail soon.   We would really appreciate it if you could fill that out for us and return it in the mail.  We value the feedback to make improvements and continue the great work we do.   If you have any questions please feel free to call me back at [phone]
